# Patient Record
Sex: Female | Born: 1975 | Race: Black or African American | Hispanic: No | Marital: Single | Smoking: Never smoker
Health system: Southern US, Community
[De-identification: ages and names within clinical notes are randomized; demographics above are authoritative.]

## PROBLEM LIST (undated history)

## (undated) ENCOUNTER — Inpatient Hospital Stay (HOSPITAL_COMMUNITY): Payer: Self-pay

## (undated) DIAGNOSIS — N760 Acute vaginitis: Secondary | ICD-10-CM

## (undated) DIAGNOSIS — D649 Anemia, unspecified: Secondary | ICD-10-CM

## (undated) DIAGNOSIS — B9689 Other specified bacterial agents as the cause of diseases classified elsewhere: Secondary | ICD-10-CM

---

## 1998-02-16 ENCOUNTER — Encounter: Payer: Self-pay | Admitting: Obstetrics

## 1998-02-16 ENCOUNTER — Inpatient Hospital Stay (HOSPITAL_COMMUNITY): Admission: AD | Admit: 1998-02-16 | Discharge: 1998-02-16 | Payer: Self-pay | Admitting: Obstetrics

## 1998-02-18 ENCOUNTER — Inpatient Hospital Stay (HOSPITAL_COMMUNITY): Admission: AD | Admit: 1998-02-18 | Discharge: 1998-02-18 | Payer: Self-pay | Admitting: Obstetrics

## 1998-02-25 ENCOUNTER — Encounter: Admission: RE | Admit: 1998-02-25 | Discharge: 1998-02-25 | Payer: Self-pay | Admitting: Family Medicine

## 1998-03-09 ENCOUNTER — Encounter: Admission: RE | Admit: 1998-03-09 | Discharge: 1998-03-09 | Payer: Self-pay | Admitting: Family Medicine

## 1998-03-30 ENCOUNTER — Encounter: Admission: RE | Admit: 1998-03-30 | Discharge: 1998-03-30 | Payer: Self-pay | Admitting: Family Medicine

## 1998-05-10 ENCOUNTER — Encounter: Admission: RE | Admit: 1998-05-10 | Discharge: 1998-05-10 | Payer: Self-pay | Admitting: Family Medicine

## 1998-05-17 ENCOUNTER — Encounter: Admission: RE | Admit: 1998-05-17 | Discharge: 1998-05-17 | Payer: Self-pay | Admitting: Family Medicine

## 1998-05-24 ENCOUNTER — Encounter: Admission: RE | Admit: 1998-05-24 | Discharge: 1998-05-24 | Payer: Self-pay | Admitting: Sports Medicine

## 1998-06-09 ENCOUNTER — Inpatient Hospital Stay (HOSPITAL_COMMUNITY): Admission: AD | Admit: 1998-06-09 | Discharge: 1998-06-11 | Payer: Self-pay | Admitting: Obstetrics & Gynecology

## 1998-06-10 ENCOUNTER — Encounter: Admission: RE | Admit: 1998-06-10 | Discharge: 1998-06-10 | Payer: Self-pay | Admitting: Family Medicine

## 1998-09-20 ENCOUNTER — Encounter: Payer: Self-pay | Admitting: *Deleted

## 1998-09-20 ENCOUNTER — Emergency Department (HOSPITAL_COMMUNITY): Admission: EM | Admit: 1998-09-20 | Discharge: 1998-09-20 | Payer: Self-pay | Admitting: *Deleted

## 1998-09-30 ENCOUNTER — Encounter: Admission: RE | Admit: 1998-09-30 | Discharge: 1998-09-30 | Payer: Self-pay | Admitting: Family Medicine

## 1998-10-05 ENCOUNTER — Encounter: Admission: RE | Admit: 1998-10-05 | Discharge: 1998-10-05 | Payer: Self-pay | Admitting: Family Medicine

## 1998-10-17 ENCOUNTER — Encounter: Admission: RE | Admit: 1998-10-17 | Discharge: 1998-10-17 | Payer: Self-pay | Admitting: Sports Medicine

## 1998-10-26 ENCOUNTER — Encounter: Admission: RE | Admit: 1998-10-26 | Discharge: 1998-10-26 | Payer: Self-pay | Admitting: Family Medicine

## 1998-12-06 ENCOUNTER — Emergency Department (HOSPITAL_COMMUNITY): Admission: EM | Admit: 1998-12-06 | Discharge: 1998-12-06 | Payer: Self-pay | Admitting: Emergency Medicine

## 1998-12-07 ENCOUNTER — Encounter: Payer: Self-pay | Admitting: Emergency Medicine

## 1999-02-02 ENCOUNTER — Encounter: Admission: RE | Admit: 1999-02-02 | Discharge: 1999-02-02 | Payer: Self-pay | Admitting: Family Medicine

## 1999-07-13 ENCOUNTER — Encounter: Admission: RE | Admit: 1999-07-13 | Discharge: 1999-07-13 | Payer: Self-pay | Admitting: Family Medicine

## 1999-09-21 ENCOUNTER — Encounter: Admission: RE | Admit: 1999-09-21 | Discharge: 1999-09-21 | Payer: Self-pay | Admitting: Family Medicine

## 1999-09-21 ENCOUNTER — Other Ambulatory Visit: Admission: RE | Admit: 1999-09-21 | Discharge: 1999-09-21 | Payer: Self-pay | Admitting: Obstetrics & Gynecology

## 1999-11-06 ENCOUNTER — Emergency Department (HOSPITAL_COMMUNITY): Admission: EM | Admit: 1999-11-06 | Discharge: 1999-11-06 | Payer: Self-pay | Admitting: Emergency Medicine

## 1999-12-27 ENCOUNTER — Emergency Department (HOSPITAL_COMMUNITY): Admission: EM | Admit: 1999-12-27 | Discharge: 1999-12-28 | Payer: Self-pay | Admitting: Emergency Medicine

## 1999-12-28 ENCOUNTER — Encounter: Payer: Self-pay | Admitting: Emergency Medicine

## 2000-01-31 ENCOUNTER — Encounter: Admission: RE | Admit: 2000-01-31 | Discharge: 2000-01-31 | Payer: Self-pay | Admitting: Family Medicine

## 2000-10-08 ENCOUNTER — Encounter: Admission: RE | Admit: 2000-10-08 | Discharge: 2000-10-08 | Payer: Self-pay | Admitting: Family Medicine

## 2001-03-06 ENCOUNTER — Encounter: Admission: RE | Admit: 2001-03-06 | Discharge: 2001-03-06 | Payer: Self-pay | Admitting: Family Medicine

## 2001-11-13 ENCOUNTER — Emergency Department (HOSPITAL_COMMUNITY): Admission: EM | Admit: 2001-11-13 | Discharge: 2001-11-13 | Payer: Self-pay

## 2002-06-04 ENCOUNTER — Emergency Department (HOSPITAL_COMMUNITY): Admission: EM | Admit: 2002-06-04 | Discharge: 2002-06-04 | Payer: Self-pay | Admitting: *Deleted

## 2002-11-09 ENCOUNTER — Encounter: Payer: Self-pay | Admitting: Emergency Medicine

## 2002-11-09 ENCOUNTER — Emergency Department (HOSPITAL_COMMUNITY): Admission: EM | Admit: 2002-11-09 | Discharge: 2002-11-09 | Payer: Self-pay | Admitting: Emergency Medicine

## 2002-11-26 ENCOUNTER — Other Ambulatory Visit: Admission: RE | Admit: 2002-11-26 | Discharge: 2002-11-26 | Payer: Self-pay | Admitting: Family Medicine

## 2002-11-26 ENCOUNTER — Encounter: Admission: RE | Admit: 2002-11-26 | Discharge: 2002-11-26 | Payer: Self-pay | Admitting: Family Medicine

## 2007-03-12 ENCOUNTER — Telehealth: Payer: Self-pay | Admitting: *Deleted

## 2007-03-12 ENCOUNTER — Emergency Department (HOSPITAL_COMMUNITY): Admission: EM | Admit: 2007-03-12 | Discharge: 2007-03-12 | Payer: Self-pay | Admitting: Emergency Medicine

## 2007-03-17 ENCOUNTER — Emergency Department (HOSPITAL_COMMUNITY): Admission: EM | Admit: 2007-03-17 | Discharge: 2007-03-17 | Payer: Self-pay | Admitting: Family Medicine

## 2007-05-04 ENCOUNTER — Emergency Department (HOSPITAL_COMMUNITY): Admission: EM | Admit: 2007-05-04 | Discharge: 2007-05-04 | Payer: Self-pay | Admitting: Family Medicine

## 2010-03-30 ENCOUNTER — Emergency Department (HOSPITAL_COMMUNITY): Admission: EM | Admit: 2010-03-30 | Discharge: 2010-03-30 | Payer: Self-pay | Admitting: Family Medicine

## 2010-09-14 LAB — HEPATIC FUNCTION PANEL
ALT: 14 U/L (ref 0–35)
AST: 20 U/L (ref 0–37)
Albumin: 4.1 g/dL (ref 3.5–5.2)
Alkaline Phosphatase: 57 U/L (ref 39–117)
Indirect Bilirubin: 1 mg/dL — ABNORMAL HIGH (ref 0.3–0.9)
Total Protein: 7 g/dL (ref 6.0–8.3)

## 2010-09-14 LAB — DIFFERENTIAL
Basophils Absolute: 0 10*3/uL (ref 0.0–0.1)
Eosinophils Relative: 1 % (ref 0–5)
Lymphocytes Relative: 54 % — ABNORMAL HIGH (ref 12–46)
Lymphs Abs: 2.1 10*3/uL (ref 0.7–4.0)
Neutro Abs: 1.4 10*3/uL — ABNORMAL LOW (ref 1.7–7.7)
Neutrophils Relative %: 38 % — ABNORMAL LOW (ref 43–77)

## 2010-09-14 LAB — WET PREP, GENITAL
Clue Cells Wet Prep HPF POC: NONE SEEN
Trich, Wet Prep: NONE SEEN

## 2010-09-14 LAB — POCT URINALYSIS DIPSTICK
Glucose, UA: NEGATIVE mg/dL
Nitrite: NEGATIVE
pH: 7 (ref 5.0–8.0)

## 2010-09-14 LAB — POCT I-STAT, CHEM 8
BUN: 9 mg/dL (ref 6–23)
Calcium, Ion: 1.2 mmol/L (ref 1.12–1.32)
Chloride: 107 mEq/L (ref 96–112)
Creatinine, Ser: 0.8 mg/dL (ref 0.4–1.2)
TCO2: 25 mmol/L (ref 0–100)

## 2010-09-14 LAB — CBC
MCV: 93.2 fL (ref 78.0–100.0)
Platelets: 189 10*3/uL (ref 150–400)
RBC: 4.55 MIL/uL (ref 3.87–5.11)
RDW: 12.1 % (ref 11.5–15.5)
WBC: 3.8 10*3/uL — ABNORMAL LOW (ref 4.0–10.5)

## 2010-09-14 LAB — GC/CHLAMYDIA PROBE AMP, GENITAL: GC Probe Amp, Genital: NEGATIVE

## 2010-09-14 LAB — POCT PREGNANCY, URINE: Preg Test, Ur: NEGATIVE

## 2011-04-10 LAB — WET PREP, GENITAL: Yeast Wet Prep HPF POC: NONE SEEN

## 2011-04-10 LAB — GC/CHLAMYDIA PROBE AMP, GENITAL: Chlamydia, DNA Probe: NEGATIVE

## 2011-04-12 LAB — I-STAT 8, (EC8 V) (CONVERTED LAB)
Acid-base deficit: 1
BUN: 9
Chloride: 107
HCT: 41
Hemoglobin: 13.9
Operator id: 116391
Potassium: 3.7

## 2011-04-12 LAB — POCT URINALYSIS DIP (DEVICE)
Nitrite: POSITIVE — AB
Operator id: 116391
Protein, ur: NEGATIVE
Urobilinogen, UA: 0.2

## 2011-04-12 LAB — DIFFERENTIAL
Lymphs Abs: 1.8
Monocytes Absolute: 0.3
Monocytes Relative: 8
Neutro Abs: 2.2
Neutrophils Relative %: 51

## 2011-04-12 LAB — CBC
Hemoglobin: 10.9 — ABNORMAL LOW
MCV: 79.5
RBC: 4.26
WBC: 4.4

## 2011-04-12 LAB — POCT I-STAT CREATININE: Creatinine, Ser: 1.1

## 2011-04-12 LAB — POCT PREGNANCY, URINE: Preg Test, Ur: NEGATIVE

## 2011-04-13 LAB — CULTURE, BLOOD (ROUTINE X 2)
Culture: NO GROWTH
Culture: NO GROWTH

## 2011-04-13 LAB — URINALYSIS, ROUTINE W REFLEX MICROSCOPIC
Bilirubin Urine: NEGATIVE
Glucose, UA: NEGATIVE
Ketones, ur: 15 — AB
Specific Gravity, Urine: 1.007
pH: 8.5 — ABNORMAL HIGH

## 2011-04-13 LAB — CBC
HCT: 35.4 — ABNORMAL LOW
Hemoglobin: 11.4 — ABNORMAL LOW
MCHC: 32.2
MCV: 80.5
Platelets: 292
RBC: 4.4
RDW: 16.1 — ABNORMAL HIGH
WBC: 11.3 — ABNORMAL HIGH

## 2011-04-13 LAB — POCT I-STAT CREATININE
Creatinine, Ser: 1
Operator id: 288331

## 2011-04-13 LAB — I-STAT 8, (EC8 V) (CONVERTED LAB)
Acid-base deficit: 1
Bicarbonate: 18.1 — ABNORMAL LOW
HCT: 38
Operator id: 288331
pCO2, Ven: 18.5 — ABNORMAL LOW
pH, Ven: 7.6 — ABNORMAL HIGH

## 2011-04-13 LAB — DIFFERENTIAL
Eosinophils Absolute: 0
Lymphs Abs: 0.7
Neutro Abs: 10.3 — ABNORMAL HIGH
Neutrophils Relative %: 91 — ABNORMAL HIGH

## 2011-04-13 LAB — POCT PREGNANCY, URINE
Operator id: 28833
Preg Test, Ur: NEGATIVE

## 2011-04-13 LAB — URINE MICROSCOPIC-ADD ON

## 2011-10-10 ENCOUNTER — Ambulatory Visit (INDEPENDENT_AMBULATORY_CARE_PROVIDER_SITE_OTHER): Payer: Managed Care, Other (non HMO) | Admitting: Internal Medicine

## 2011-10-10 ENCOUNTER — Ambulatory Visit: Payer: Managed Care, Other (non HMO)

## 2011-10-10 VITALS — BP 117/75 | HR 92 | Temp 98.4°F | Resp 16 | Ht 65.75 in | Wt 113.0 lb

## 2011-10-10 DIAGNOSIS — J029 Acute pharyngitis, unspecified: Secondary | ICD-10-CM

## 2011-10-10 DIAGNOSIS — R10A0 Flank pain, unspecified side: Secondary | ICD-10-CM

## 2011-10-10 DIAGNOSIS — IMO0001 Reserved for inherently not codable concepts without codable children: Secondary | ICD-10-CM

## 2011-10-10 DIAGNOSIS — J40 Bronchitis, not specified as acute or chronic: Secondary | ICD-10-CM

## 2011-10-10 DIAGNOSIS — R109 Unspecified abdominal pain: Secondary | ICD-10-CM

## 2011-10-10 DIAGNOSIS — M7918 Myalgia, other site: Secondary | ICD-10-CM

## 2011-10-10 LAB — POCT URINALYSIS DIPSTICK
Glucose, UA: NEGATIVE
Nitrite, UA: NEGATIVE
Urobilinogen, UA: 0.2

## 2011-10-10 LAB — POCT UA - MICROSCOPIC ONLY
Casts, Ur, LPF, POC: NEGATIVE
Crystals, Ur, HPF, POC: NEGATIVE
Yeast, UA: NEGATIVE

## 2011-10-10 LAB — POCT RAPID STREP A (OFFICE): Rapid Strep A Screen: NEGATIVE

## 2011-10-10 MED ORDER — HYDROCODONE-ACETAMINOPHEN 5-325 MG PO TABS: 1.0000 | ORAL_TABLET | Freq: Four times a day (QID) | ORAL | Status: AC | PRN

## 2011-10-10 MED ORDER — AZITHROMYCIN 500 MG PO TABS: 500.0000 mg | ORAL_TABLET | Freq: Every day | ORAL | Status: AC

## 2011-10-10 NOTE — Progress Notes (Signed)
Subjective:    Patient ID: Kathy Sharp, female    DOB: 1975-12-16, 36 y.o.   MRN: [id number]  HPI Onset of illness sat with n/v/d/- resolved next day developed sorethroat and cough Cough severe keeps her up at night chills low grade temp? Pain 6/10 r flank and mid back worse with cough and motion no radiation moderate in severity No dysuria iud on menses now     Review of Systems  Constitutional: Positive for chills, appetite change and fatigue.  HENT: Negative.   Eyes: Negative.   Respiratory: Positive for cough. Negative for shortness of breath.   Cardiovascular: Negative.   Gastrointestinal: Negative.   Genitourinary: Negative.   Musculoskeletal: Positive for back pain.  Skin: Negative.   Neurological: Negative.   Hematological: Negative.   Psychiatric/Behavioral: Negative.   All other systems reviewed and are negative.       Objective:   Physical Exam  Vitals reviewed. Constitutional: She is oriented to person, place, and time. She appears well-developed and well-nourished.  HENT:  Head: Normocephalic and atraumatic.  Right Ear: External ear normal.  Left Ear: External ear normal.  Nose: Nose normal.  Mouth/Throat: Oropharyngeal exudate present.  Eyes: Conjunctivae and EOM are normal. Pupils are equal, round, and reactive to light.  Neck: Normal range of motion. Neck supple.  Cardiovascular: Normal rate, regular rhythm and normal heart sounds.   Pulmonary/Chest: No respiratory distress. She has no wheezes. She has no rales. She exhibits no tenderness.       Rhonchi decrease bs rll  Abdominal: Soft. Bowel sounds are normal.  Musculoskeletal: She exhibits tenderness.       r flank  Lymphadenopathy:    She has no cervical adenopathy.  Neurological: She is alert and oriented to person, place, and time. She has normal reflexes.  Skin: Skin is warm and dry.  Psychiatric: She has a normal mood and affect. Her behavior is normal. Judgment and thought content  normal.    Results for orders placed in visit on 10/10/11  POCT RAPID STREP A (OFFICE)      Component Value Range   Rapid Strep A Screen Negative  Negative   POCT URINALYSIS DIPSTICK      Component Value Range   Color, UA yellow     Clarity, UA clear     Glucose, UA neg     Bilirubin, UA neg     Ketones, UA neg     Spec Grav, UA 1.020     Blood, UA mod     pH, UA 7.0     Protein, UA trace     Urobilinogen, UA 0.2     Nitrite, UA neg     Leukocytes, UA Negative    POCT UA - MICROSCOPIC ONLY      Component Value Range   WBC, Ur, HPF, POC 2-4     RBC, urine, microscopic 1-3     Bacteria, U Microscopic trace     Mucus, UA 2+     Epithelial cells, urine per micros 2-9     Crystals, Ur, HPF, POC neg     Casts, Ur, LPF, POC neg     Yeast, UA neg     strep negitives urine normal [facility] reading (PRIMARY) by  Dr. Mindi Junker Chest xray no infiltrate.    Assessment & Plan:  Cough suspect bronchitus will check chest xray since pain and decrease bs in the r lower lobe. Also will check ua to evaluate for uti and will  check strep screen because of the sore throat.

## 2012-02-12 ENCOUNTER — Ambulatory Visit (INDEPENDENT_AMBULATORY_CARE_PROVIDER_SITE_OTHER): Payer: Managed Care, Other (non HMO) | Admitting: Physician Assistant

## 2012-02-12 VITALS — BP 120/60 | HR 63 | Temp 98.3°F | Resp 16 | Ht 64.75 in | Wt 114.4 lb

## 2012-02-12 DIAGNOSIS — N949 Unspecified condition associated with female genital organs and menstrual cycle: Secondary | ICD-10-CM

## 2012-02-12 DIAGNOSIS — R102 Pelvic and perineal pain: Secondary | ICD-10-CM

## 2012-02-12 DIAGNOSIS — N912 Amenorrhea, unspecified: Secondary | ICD-10-CM

## 2012-02-12 DIAGNOSIS — Z01419 Encounter for gynecological examination (general) (routine) without abnormal findings: Secondary | ICD-10-CM

## 2012-02-12 DIAGNOSIS — N938 Other specified abnormal uterine and vaginal bleeding: Secondary | ICD-10-CM

## 2012-02-12 LAB — POCT CBC
Granulocyte percent: 44.4 %G (ref 37–80)
MCV: 89.4 fL (ref 80–97)
MID (cbc): 0.5 (ref 0–0.9)
MPV: 8.6 fL (ref 0–99.8)
POC Granulocyte: 2.2 (ref 2–6.9)
POC LYMPH PERCENT: 45.4 %L (ref 10–50)
POC MID %: 10.2 %M (ref 0–12)
Platelet Count, POC: 259 10*3/uL (ref 142–424)
RBC: 4.63 M/uL (ref 4.04–5.48)
RDW, POC: 17.1 %

## 2012-02-12 LAB — POCT URINE PREGNANCY: Preg Test, Ur: NEGATIVE

## 2012-02-12 LAB — POCT WET PREP WITH KOH
KOH Prep POC: NEGATIVE
Trichomonas, UA: NEGATIVE

## 2012-02-12 MED ORDER — FLUCONAZOLE 150 MG PO TABS: 150.0000 mg | ORAL_TABLET | Freq: Once | ORAL | Status: AC

## 2012-02-12 MED ORDER — EPINEPHRINE 0.3 MG/0.3ML IJ DEVI: 0.3000 mg | Freq: Once | INTRAMUSCULAR | Status: DC

## 2012-02-12 MED ORDER — LEVONORGESTREL-ETHINYL ESTRAD 0.1-20 MG-MCG PO TABS: 1.0000 | ORAL_TABLET | Freq: Every day | ORAL | Status: DC

## 2012-02-12 MED ORDER — AZITHROMYCIN 250 MG PO TABS: ORAL_TABLET | ORAL | Status: AC

## 2012-02-12 MED ORDER — CEFIXIME 400 MG PO TABS: 400.0000 mg | ORAL_TABLET | Freq: Once | ORAL | Status: AC

## 2012-02-12 NOTE — Progress Notes (Signed)
Subjective:    Patient ID: Kathy Sharp, female    DOB: 12/07/1975, 36 y.o.   MRN: [id number]  HPI 36 yr old AAF presents with 3 week h/o pelvic pain and abnormal bleeding.  She had an IUD placed 1 year ago and has had no bleeding since until 3 weeks ago.  New partner X 6 months, before that was monogamous with previous partner for 8 years. She used a douche about 5 days ago without any help.  She has also noticed abnormal  Discharge and a foul odor started this week. She is still spotting.  She has decided she wants to have her IUD removed. She feels like the IUD itself is causing a lot of her problems.   Review of Systems  All other systems reviewed and are negative.       Objective:   Physical Exam  Nursing note and vitals reviewed. Constitutional: She is oriented to person, place, and time. She appears well-developed and well-nourished.  HENT:  Head: Normocephalic and atraumatic.  Cardiovascular: Normal rate, normal heart sounds and intact distal pulses.   Pulmonary/Chest: Effort normal and breath sounds normal.  Genitourinary: Vagina normal.       Blood and scant yellowish discharge present. Cervix appears normal and IUD in place. Ringed forceps to grasp IUD string and removed without complication.  papsmear and wet prep taken. Neg chandelier sign. Mild to mod TTP over uterus and B adnexa upon bimanual exam.  Neurological: She is alert and oriented to person, place, and time.  Skin: Skin is warm and dry.  Psychiatric: She has a normal mood and affect. Her behavior is normal. Thought content normal.     Results for orders placed in visit on 02/12/12  POCT WET PREP WITH KOH      Component Value Range   Trichomonas, UA Negative     Clue Cells Wet Prep HPF POC 1-2     Epithelial Wet Prep HPF POC 2-3     Yeast Wet Prep HPF POC neg     Bacteria Wet Prep HPF POC 1+     RBC Wet Prep HPF POC 2-4     WBC Wet Prep HPF POC 2-4     KOH Prep POC Negative    POCT URINE PREGNANCY   Component Value Range   Preg Test, Ur Negative    POCT CBC      Component Value Range   WBC 4.9  4.6 - 10.2 K/uL   Lymph, poc 2.2  0.6 - 3.4   POC LYMPH PERCENT 45.4  10 - 50 %L   MID (cbc) 0.5  0 - 0.9   POC MID % 10.2  0 - 12 %M   POC Granulocyte 2.2  2 - 6.9   Granulocyte percent 44.4  37 - 80 %G   RBC 4.63  4.04 - 5.48 M/uL   Hemoglobin 12.4  12.2 - 16.2 g/dL   HCT, POC 78.2  95.6 - 47.9 %   MCV 89.4  80 - 97 fL   MCH, POC 26.8 (*) 27 - 31.2 pg   MCHC 30.0 (*) 31.8 - 35.4 g/dL   RDW, POC 21.3     Platelet Count, POC 259  142 - 424 K/uL   MPV 8.6  0 - 99.8 fL         Assessment & Plan:  Pelvic pain and dysfunctional uterine bleeding-cover for GC/chlamydia bc high risk for disseminated disease if either are positive due to IUD  and removal. Start Alesse. Safe sex practices.  See sent labs. IUD removal successful.

## 2012-02-18 LAB — PAP IG, CT-NG NAA, HPV HIGH-RISK: HPV DNA High Risk: NOT DETECTED

## 2012-04-22 ENCOUNTER — Ambulatory Visit: Payer: Managed Care, Other (non HMO)

## 2012-04-22 ENCOUNTER — Ambulatory Visit (INDEPENDENT_AMBULATORY_CARE_PROVIDER_SITE_OTHER): Payer: Managed Care, Other (non HMO) | Admitting: Family Medicine

## 2012-04-22 VITALS — BP 127/84 | HR 73 | Temp 98.2°F | Resp 17 | Ht 65.5 in | Wt 118.0 lb

## 2012-04-22 DIAGNOSIS — M79641 Pain in right hand: Secondary | ICD-10-CM

## 2012-04-22 DIAGNOSIS — M109 Gout, unspecified: Secondary | ICD-10-CM

## 2012-04-22 DIAGNOSIS — M25549 Pain in joints of unspecified hand: Secondary | ICD-10-CM

## 2012-04-22 LAB — POCT CBC
HCT, POC: 40.3 % (ref 37.7–47.9)
Hemoglobin: 12 g/dL — AB (ref 12.2–16.2)
Lymph, poc: 2.3 (ref 0.6–3.4)
MCH, POC: 27.3 pg (ref 27–31.2)
MCHC: 29.8 g/dL — AB (ref 31.8–35.4)
MPV: 8 fL (ref 0–99.8)
POC MID %: 8.8 %M (ref 0–12)
RBC: 4.4 M/uL (ref 4.04–5.48)
WBC: 5 10*3/uL (ref 4.6–10.2)

## 2012-04-22 MED ORDER — KETOROLAC TROMETHAMINE 60 MG/2ML IM SOLN
60.0000 mg | Freq: Once | INTRAMUSCULAR | Status: AC
  Administered 2012-04-22: 60 mg via INTRAMUSCULAR

## 2012-04-22 MED ORDER — INDOMETHACIN 50 MG PO CAPS: 50.0000 mg | ORAL_CAPSULE | Freq: Two times a day (BID) | ORAL | Status: DC

## 2012-04-22 NOTE — Patient Instructions (Addendum)
I think you have gout.    Take the Indomethacin 3 times a day until the pain becomes better, then decrease to twice or once a day after that.    Do not take this for more than 5 days.   Come back and see [place] in 2 days so we can make sure you're getting better.    Gout Gout is an inflammatory condition (arthritis) caused by a buildup of uric acid crystals in the joints. Uric acid is a chemical that is normally present in the blood. Under some circumstances, uric acid can form into crystals in your joints. This causes joint redness, soreness, and swelling (inflammation). Repeat attacks are common. Over time, uric acid crystals can form into masses (tophi) near a joint, causing disfigurement. Gout is treatable and often preventable. CAUSES  The disease begins with elevated levels of uric acid in the blood. Uric acid is produced by your body when it breaks down a naturally found substance called purines. This also happens when you eat certain foods such as meats and fish. Causes of an elevated uric acid level include:  Being passed down from parent to child (heredity).  Diseases that cause increased uric acid production (obesity, psoriasis, some cancers).  Excessive alcohol use.  Diet, especially diets rich in meat and seafood.  Medicines, including certain cancer-fighting drugs (chemotherapy), diuretics, and aspirin.  Chronic kidney disease. The kidneys are no longer able to remove uric acid well.  Problems with metabolism. Conditions strongly associated with gout include:  Obesity.  High blood pressure.  High cholesterol.  Diabetes. Not everyone with elevated uric acid levels gets gout. It is not understood why some people get gout and others do not. Surgery, joint injury, and eating too much of certain foods are some of the factors that can lead to gout. SYMPTOMS   An attack of gout comes on quickly. It causes intense pain with redness, swelling, and warmth in a joint.  Fever  can occur.  Often, only one joint is involved. Certain joints are more commonly involved:  Base of the big toe.  Knee.  Ankle.  Wrist.  Finger. Without treatment, an attack usually goes away in a few days to weeks. Between attacks, you usually will not have symptoms, which is different from many other forms of arthritis. DIAGNOSIS  Your caregiver will suspect gout based on your symptoms and exam. Removal of fluid from the joint (arthrocentesis) is done to check for uric acid crystals. Your caregiver will give you a medicine that numbs the area (local anesthetic) and use a needle to remove joint fluid for exam. Gout is confirmed when uric acid crystals are seen in joint fluid, using a special microscope. Sometimes, blood, urine, and X-ray tests are also used. TREATMENT  There are 2 phases to gout treatment: treating the sudden onset (acute) attack and preventing attacks (prophylaxis). Treatment of an Acute Attack  Medicines are used. These include anti-inflammatory medicines or steroid medicines.  An injection of steroid medicine into the affected joint is sometimes necessary.  The painful joint is rested. Movement can worsen the arthritis.  You may use warm or cold treatments on painful joints, depending which works best for you.  Discuss the use of coffee, vitamin C, or cherries with your caregiver. These may be helpful treatment options. Treatment to Prevent Attacks After the acute attack subsides, your caregiver may advise prophylactic medicine. These medicines either help your kidneys eliminate uric acid from your body or decrease your uric acid production.  You may need to stay on these medicines for a very long time. The early phase of treatment with prophylactic medicine can be associated with an increase in acute gout attacks. For this reason, during the first few months of treatment, your caregiver may also advise you to take medicines usually used for acute gout treatment. Be  sure you understand your caregiver's directions. You should also discuss dietary treatment with your caregiver. Certain foods such as meats and fish can increase uric acid levels. Other foods such as dairy can decrease levels. Your caregiver can give you a list of foods to avoid. HOME CARE INSTRUCTIONS   Do not take aspirin to relieve pain. This raises uric acid levels.  Only take over-the-counter or prescription medicines for pain, discomfort, or fever as directed by your caregiver.  Rest the joint as much as possible. When in bed, keep sheets and blankets off painful areas.  Keep the affected joint raised (elevated).  Use crutches if the painful joint is in your leg.  Drink enough water and fluids to keep your urine clear or pale yellow. This helps your body get rid of uric acid. Do not drink alcoholic beverages. They slow the passage of uric acid.  Follow your caregiver's dietary instructions. Pay careful attention to the amount of protein you eat. Your daily diet should emphasize fruits, vegetables, whole grains, and fat-free or low-fat milk products.  Maintain a healthy body weight. SEEK MEDICAL CARE IF:   You have an oral temperature above 102 F (38.9 C).  You develop diarrhea, vomiting, or any side effects from medicines.  You do not feel better in 24 hours, or you are getting worse. SEEK IMMEDIATE MEDICAL CARE IF:   Your joint becomes suddenly more tender and you have:  Chills.  An oral temperature above 102 F (38.9 C), not controlled by medicine. MAKE SURE YOU:   Understand these instructions.  Will watch your condition.  Will get help right away if you are not doing well or get worse. Document Released: 06/15/2000 Document Revised: 09/10/2011 Document Reviewed: 09/26/2009 [facility] Patient Information 2013 [place], [place].

## 2012-04-22 NOTE — Progress Notes (Signed)
Patient ID: Kathy Sharp, female   DOB: 06-05-1976, 36 y.o.   MRN: [id number] Kathy Sharp is a 36 y.o. female who presents to Urgent Care today for Right hand pain:  1.  Right hand pain:  Patient playing with her dog and child 2 days ago, no injury that she knew of occurred at that time.  Noticed some soreness in Right hand later that evening.  Pain much worse next day, which was yesterday.  Noted "knot" on back of her hand.  This AM she awoke with very severe pain dorsal aspect of Right hand.  Redness persists.  Painful when clothing or bedsheet rubbed across her wrist.  Pain also with supination/pronation of hand.  No fevers or chills.  No red streaks up arm.    PMH reviewed.  ROS as above otherwise neg.  No chest pain, palpitations, SOB, Abd pain, N/V/D.  Medications reviewed. Current Outpatient Prescriptions  Medication Sig Dispense Refill  . EPINEPHrine (EPI-PEN) 0.3 mg/0.3 mL DEVI Inject 0.3 mLs (0.3 mg total) into the muscle once. In event of severe allergic reaction.  1 Device  0  . levonorgestrel-ethinyl estradiol (AVIANE,ALESSE,LESSINA) 0.1-20 MG-MCG tablet Take 1 tablet by mouth daily.  1 Package  11    Exam:  BP 127/84  Pulse 73  Temp 98.2 F (36.8 C) (Oral)  Resp 17  Ht 5' 5.5" (1.664 m)  Wt 118 lb (53.524 kg)  BMI 19.34 kg/m2  SpO2 100% Gen: Well NAD HEENT:  [place]/AT, MMM Cardiac:  Regular rate and rhythm without murmur auscultated.  Good S1/S2. Pulm:  Clear to auscultation bilaterally with good air movement.  No wheezes or rales noted.   MSK:  Right hand with diffuse soft tissue swelling located over base of 1st metacarpal.  Erythema here as well.  Very tender to palpation, even light touch.  No palpable step-off noted.  Pain with supination and pronation of hand.   SKin:  Erythema as above.  No red streaking up arm.  Rubor noted as well Lymph:  No olecranon fossa or axillary lymphadenopathy noted  [facility] reading (PRIMARY) by  Dr. Gwendolyn Grant:  Diffuse soft tissue swelling  noted over 1st metacarpal.  Old ulnar styloid fracture versus malunion noted as well.  Results for orders placed in visit on 04/22/12  POCT CBC      Component Value Range   WBC 5.0  4.6 - 10.2 K/uL   Lymph, poc 2.3  0.6 - 3.4   POC LYMPH PERCENT 46.0  10 - 50 %L   MID (cbc) 0.4  0 - 0.9   POC MID % 8.8  0 - 12 %M   POC Granulocyte 2.3  2 - 6.9   Granulocyte percent 45.2  37 - 80 %G   RBC 4.40  4.04 - 5.48 M/uL   Hemoglobin 12.0 (*) 12.2 - 16.2 g/dL   HCT, POC 66.4  40.3 - 47.9 %   MCV 91.5  80 - 97 fL   MCH, POC 27.3  27 - 31.2 pg   MCHC 29.8 (*) 31.8 - 35.4 g/dL   RDW, POC 47.4     Platelet Count, POC 287  142 - 424 K/uL   MPV 8.0  0 - 99.8 fL    Assessment and Plan:  1.  Gout:  Nontraumatic pain and swelling at base of 1st metacarpal with extensive heat.  No signs of lymphadenopathy.  Xray negative for any acute fracture.  Differential includes gout versus cellulitis versus occult fracture.  Checking CBC and uric acid today, WBC within normal limits.  FU in 48 hours for recheck and to assess for improvement.  Toradol here, Indomethacin for relief at home.  Sling for relief since pain is worsened by pronation and supination.

## 2012-04-23 NOTE — Progress Notes (Signed)
I have read, reviewed, and agree with plan of care by Dr. Walden  

## 2012-12-17 ENCOUNTER — Ambulatory Visit (INDEPENDENT_AMBULATORY_CARE_PROVIDER_SITE_OTHER): Payer: BC Managed Care – PPO | Admitting: Family Medicine

## 2012-12-17 VITALS — BP 110/82 | HR 70 | Temp 98.3°F | Resp 18 | Wt 112.0 lb

## 2012-12-17 DIAGNOSIS — R112 Nausea with vomiting, unspecified: Secondary | ICD-10-CM

## 2012-12-17 DIAGNOSIS — R1013 Epigastric pain: Secondary | ICD-10-CM

## 2012-12-17 LAB — COMPREHENSIVE METABOLIC PANEL
ALT: 15 U/L (ref 0–35)
AST: 20 U/L (ref 0–37)
Alkaline Phosphatase: 55 U/L (ref 39–117)
CO2: 26 mEq/L (ref 19–32)
Creat: 0.83 mg/dL (ref 0.50–1.10)
Total Bilirubin: 1.2 mg/dL (ref 0.3–1.2)

## 2012-12-17 LAB — POCT UA - MICROSCOPIC ONLY
Casts, Ur, LPF, POC: NEGATIVE
Crystals, Ur, HPF, POC: NEGATIVE
Mucus, UA: POSITIVE
Yeast, UA: NEGATIVE

## 2012-12-17 LAB — POCT URINALYSIS DIPSTICK
Blood, UA: NEGATIVE
Nitrite, UA: NEGATIVE
Protein, UA: 30
Spec Grav, UA: >=1.03
Urobilinogen, UA: 0.2
pH, UA: 6

## 2012-12-17 LAB — POCT CBC
MCH, POC: 26.9 pg — AB (ref 27–31.2)
MCV: 89.8 fL (ref 80–97)
MID (cbc): 0.3 (ref 0–0.9)
MPV: 8.5 fL (ref 0–99.8)
POC LYMPH PERCENT: 36.8 %L (ref 10–50)
POC MID %: 7.4 %M (ref 0–12)
Platelet Count, POC: 251 10*3/uL (ref 142–424)
RDW, POC: 15.1 %
WBC: 4.3 10*3/uL — AB (ref 4.6–10.2)

## 2012-12-17 MED ORDER — ONDANSETRON 8 MG PO TBDP: 8.0000 mg | ORAL_TABLET | Freq: Three times a day (TID) | ORAL | Status: DC | PRN

## 2012-12-17 NOTE — Progress Notes (Signed)
[address]   [place], [place]  [address]   [phone]  Subjective:    Patient ID: Kathy Sharp, female    DOB: 17-Mar-1976, 37 y.o.   MRN: [id number]  HPI This 37 y.o. female presents for evaluation of decreased appetite, nausea, stomach griping.  Onset five days ago.  No fever/chills/sweats.  Did suffer with loose bowels on day of onset; had four stools on day of onset; baseline once every three days.  No black or bloody stools.  No bowel movement since onset.  Drinking a lot of water but got dizzy yesterday; has not been eating much.  Nausea constant but can be intermittent.  Nausea was better yesterday; awoke in middle of night with acute onset of nausea.  Vomiting x 4 times since onset; food contents.  Non-bloody emesis.  Abdominal pain upper stomach and radiates down a little; feels like a cramp and punch; intermittent pain during the day; eating makes worse.  No dysuria, frequency, hematuria.  No vaginal discharge; no vaginal itching or irritation.  LMP 11-30-2012; menses have not been normal; bleeding was darker and discharge for two days and then stopped and then had heavy menses for two days.  IUD removed in past six months.No recent antibiotics; no recent travel; no recent camping.   Works at [employer]; Midwife.  Admits to stress; mother with cancer liver with Hepatitis C; was with [facility] but now in nursing home.  Feels badly; +malaise and fatigue thus does not feel due to stress.  Sexually active; new partner for past four months.  PCP:  [facility]   Review of Systems  Constitutional: Positive for appetite change and fatigue. Negative for fever, chills and diaphoresis.  HENT: Negative for sore throat.   Gastrointestinal: Positive for nausea, vomiting, abdominal pain and diarrhea. Negative for constipation, blood in stool, anal bleeding and rectal pain.  Genitourinary: Negative for dysuria, urgency, frequency, hematuria, flank pain and vaginal discharge.  Skin: Negative  for rash.  Neurological: Positive for dizziness and light-headedness. Negative for headaches.    History reviewed. No pertinent past medical history.  History reviewed. No pertinent past surgical history.  Prior to Admission medications   Medication Sig Start Date End Date Taking? Authorizing Provider  EPINEPHrine (EPI-PEN) 0.3 mg/0.3 mL DEVI Inject 0.3 mLs (0.3 mg total) into the muscle once. In event of severe allergic reaction. 02/12/12  Yes Anders Simmonds, PA-C  indomethacin (INDOCIN) 50 MG capsule Take 1 capsule (50 mg total) by mouth 2 (two) times daily with a meal. 04/22/12   Tobey Grim, MD  levonorgestrel-ethinyl estradiol (AVIANE,ALESSE,LESSINA) 0.1-20 MG-MCG tablet Take 1 tablet by mouth daily. 02/12/12 02/11/13  Anders Simmonds, PA-C    Allergies  Allergen Reactions  . Penicillins Rash    Reaction as a baby-hives, but she has taken cephalosporins without any problem.    History   Social History  . Marital Status: Single    Spouse Name: N/A    Number of Children: N/A  . Years of Education: N/A   Occupational History  . Not on file.   Social History Main Topics  . Smoking status: Never Smoker   . Smokeless tobacco: Not on file  . Alcohol Use: No  . Drug Use: No  . Sexually Active: Yes    Birth Control/ Protection: None   Other Topics Concern  . Not on file   Social History Narrative  . No narrative on file    Family History  Problem Relation Age of Onset  . Cancer Mother        Objective:   Physical Exam  Nursing note and vitals reviewed. Constitutional: She is oriented to person, place, and time. She appears well-developed and well-nourished. No distress.  HENT:  Head: Normocephalic and atraumatic.  Mouth/Throat: Oropharynx is clear and moist.  Eyes: Conjunctivae are normal. Pupils are equal, round, and reactive to light.  Neck: Normal range of motion. Neck supple. No thyromegaly present.  Cardiovascular: Normal rate, regular rhythm and  normal heart sounds.   No murmur heard. Pulmonary/Chest: Effort normal and breath sounds normal.  Abdominal: Soft. Bowel sounds are normal. She exhibits no distension. There is no tenderness. There is no rebound and no guarding.  Lymphadenopathy:    She has no cervical adenopathy.  Neurological: She is alert and oriented to person, place, and time.  Skin: Skin is warm and dry. No rash noted. She is not diaphoretic.  Psychiatric: She has a normal mood and affect. Her behavior is normal.   Results for orders placed in visit on 12/17/12  POCT CBC      Result Value Range   WBC 4.3 (*) 4.6 - 10.2 K/uL   Lymph, poc 1.6  0.6 - 3.4   POC LYMPH PERCENT 36.8  10 - 50 %L   MID (cbc) 0.3  0 - 0.9   POC MID % 7.4  0 - 12 %M   POC Granulocyte 2.4  2 - 6.9   Granulocyte percent 55.8  37 - 80 %G   RBC 4.61  4.04 - 5.48 M/uL   Hemoglobin 12.4  12.2 - 16.2 g/dL   HCT, POC 16.1  09.6 - 47.9 %   MCV 89.8  80 - 97 fL   MCH, POC 26.9 (*) 27 - 31.2 pg   MCHC 30.0 (*) 31.8 - 35.4 g/dL   RDW, POC 04.5     Platelet Count, POC 251  142 - 424 K/uL   MPV 8.5  0 - 99.8 fL  POCT URINALYSIS DIPSTICK      Result Value Range   Color, UA amber     Clarity, UA clear     Glucose, UA neg     Bilirubin, UA small     Ketones, UA 40     Spec Grav, UA >=1.030     Blood, UA neg     pH, UA 6.0     Protein, UA 30     Urobilinogen, UA 0.2     Nitrite, UA neg     Leukocytes, UA Negative    POCT URINE PREGNANCY      Result Value Range   Preg Test, Ur Negative    POCT UA - MICROSCOPIC ONLY      Result Value Range   WBC, Ur, HPF, POC 1-3     RBC, urine, microscopic neg     Bacteria, U Microscopic trace     Mucus, UA positive     Epithelial cells, urine per micros 1-5     Crystals, Ur, HPF, POC neg     Casts, Ur, LPF, POC neg     Yeast, UA neg         Assessment & Plan:  Nausea with vomiting - Plan: POCT CBC, POCT urinalysis dipstick, POCT urine pregnancy, POCT UA - Microscopic Only, Comprehensive metabolic  panel  Abdominal pain, epigastric - Plan: POCT CBC  1.  Nausea with vomiting:  New.  Onset five days ago.  Normal/benign  abdominal exam.  Obtain labs.  Rx for Zofran provided; BRAT diet; RTC if no improvement in 3-5 days; RTC sooner for acute worsening. 2.  Abdominal pain epigastric:  New.  Associated with vomiting, diarrhea. Benign abdominal exam.   Meds ordered this encounter  Medications  . ondansetron (ZOFRAN-ODT) 8 MG disintegrating tablet    Sig: Take 1 tablet (8 mg total) by mouth every 8 (eight) hours as needed for nausea.    Dispense:  30 tablet    Refill:  0

## 2012-12-17 NOTE — Patient Instructions (Addendum)
Viral Gastroenteritis Viral gastroenteritis is also known as stomach flu. This condition affects the stomach and intestinal tract. It can cause sudden diarrhea and vomiting. The illness typically lasts 3 to 8 days. Most people develop an immune response that eventually gets rid of the virus. While this natural response develops, the virus can make you quite ill. CAUSES  Many different viruses can cause gastroenteritis, such as rotavirus or noroviruses. You can catch one of these viruses by consuming contaminated food or water. You may also catch a virus by sharing utensils or other personal items with an infected person or by touching a contaminated surface. SYMPTOMS  The most common symptoms are diarrhea and vomiting. These problems can cause a severe loss of body fluids (dehydration) and a body salt (electrolyte) imbalance. Other symptoms may include:  Fever.  Headache.  Fatigue.  Abdominal pain. DIAGNOSIS  Your caregiver can usually diagnose viral gastroenteritis based on your symptoms and a physical exam. A stool sample may also be taken to test for the presence of viruses or other infections. TREATMENT  This illness typically goes away on its own. Treatments are aimed at rehydration. The most serious cases of viral gastroenteritis involve vomiting so severely that you are not able to keep fluids down. In these cases, fluids must be given through an intravenous line (IV). HOME CARE INSTRUCTIONS   Drink enough fluids to keep your urine clear or pale yellow. Drink small amounts of fluids frequently and increase the amounts as tolerated.  Ask your caregiver for specific rehydration instructions.  Avoid:  Foods high in sugar.  Alcohol.  Carbonated drinks.  Tobacco.  Juice.  Caffeine drinks.  Extremely hot or cold fluids.  Fatty, greasy foods.  Too much intake of anything at one time.  Dairy products until 24 to 48 hours after diarrhea stops.  You may consume probiotics.  Probiotics are active cultures of beneficial bacteria. They may lessen the amount and number of diarrheal stools in adults. Probiotics can be found in yogurt with active cultures and in supplements.  Wash your hands well to avoid spreading the virus.  Only take over-the-counter or prescription medicines for pain, discomfort, or fever as directed by your caregiver. Do not give aspirin to children. Antidiarrheal medicines are not recommended.  Ask your caregiver if you should continue to take your regular prescribed and over-the-counter medicines.  Keep all follow-up appointments as directed by your caregiver. SEEK IMMEDIATE MEDICAL CARE IF:   You are unable to keep fluids down.  You do not urinate at least once every 6 to 8 hours.  You develop shortness of breath.  You notice blood in your stool or vomit. This may look like coffee grounds.  You have abdominal pain that increases or is concentrated in one small area (localized).  You have persistent vomiting or diarrhea.  You have a fever.  The patient is a child younger than 3 months, and he or she has a fever.  The patient is a child older than 3 months, and he or she has a fever and persistent symptoms.  The patient is a child older than 3 months, and he or she has a fever and symptoms suddenly get worse.  The patient is a baby, and he or she has no tears when crying. MAKE SURE YOU:   Understand these instructions.  Will watch your condition.  Will get help right away if you are not doing well or get worse. Document Released: 06/18/2005 Document Revised: 09/10/2011 Document Reviewed: 04/04/2011   ExitCare Patient Information 2014 [employer].  

## 2013-01-11 ENCOUNTER — Ambulatory Visit (INDEPENDENT_AMBULATORY_CARE_PROVIDER_SITE_OTHER): Payer: BC Managed Care – PPO | Admitting: Emergency Medicine

## 2013-01-11 VITALS — BP 98/64 | HR 94 | Temp 98.1°F | Resp 16 | Ht 65.5 in | Wt 109.8 lb

## 2013-01-11 DIAGNOSIS — A499 Bacterial infection, unspecified: Secondary | ICD-10-CM

## 2013-01-11 DIAGNOSIS — N760 Acute vaginitis: Secondary | ICD-10-CM

## 2013-01-11 DIAGNOSIS — N898 Other specified noninflammatory disorders of vagina: Secondary | ICD-10-CM

## 2013-01-11 DIAGNOSIS — B9689 Other specified bacterial agents as the cause of diseases classified elsewhere: Secondary | ICD-10-CM

## 2013-01-11 LAB — POCT WET PREP WITH KOH
KOH Prep POC: NEGATIVE
RBC Wet Prep HPF POC: NEGATIVE
Trichomonas, UA: NEGATIVE
Yeast Wet Prep HPF POC: NEGATIVE

## 2013-01-11 MED ORDER — METRONIDAZOLE 500 MG PO TABS: 500.0000 mg | ORAL_TABLET | Freq: Two times a day (BID) | ORAL | Status: DC

## 2013-01-11 MED ORDER — LEVONORGESTREL-ETHINYL ESTRAD 0.1-20 MG-MCG PO TABS: 1.0000 | ORAL_TABLET | Freq: Every day | ORAL | Status: DC

## 2013-01-11 NOTE — Progress Notes (Signed)
[facility] [address], [place] [address] [phone]  Date:  01/11/2013   Name:  Kathy Sharp   DOB:  1975-10-03   MRN:  [id number]  PCP:  No PCP Per Patient    Chief Complaint: Vaginitis   History of Present Illness:  Kathy Sharp is a 37 y.o. very pleasant female patient who presents with the following:  Has vaginal discharge after using a OTC vaginal pH balancing gel.  Now her vaginal discharge is worse.  Has no dysuria, urgency or frequency.  Some discomfort with intercourse but denies pain.  On pill for last year after IUD removal.  No improvement with over the counter medications or other home remedies. Denies other complaint or health concern today.   There are no active problems to display for this patient.   History reviewed. No pertinent past medical history.  History reviewed. No pertinent past surgical history.  History  Substance Use Topics  . Smoking status: Never Smoker   . Smokeless tobacco: Never Used  . Alcohol Use: No    Family History  Problem Relation Age of Onset  . Cancer Mother     Allergies  Allergen Reactions  . Penicillins Rash    Reaction as a baby-hives, but she has taken cephalosporins without any problem.    Medication list has been reviewed and updated.  Current Outpatient Prescriptions on File Prior to Visit  Medication Sig Dispense Refill  . EPINEPHrine (EPI-PEN) 0.3 mg/0.3 mL DEVI Inject 0.3 mLs (0.3 mg total) into the muscle once. In event of severe allergic reaction.  1 Device  0  . indomethacin (INDOCIN) 50 MG capsule Take 1 capsule (50 mg total) by mouth 2 (two) times daily with a meal.  15 capsule  0  . levonorgestrel-ethinyl estradiol (AVIANE,ALESSE,LESSINA) 0.1-20 MG-MCG tablet Take 1 tablet by mouth daily.  1 Package  11  . ondansetron (ZOFRAN-ODT) 8 MG disintegrating tablet Take 1 tablet (8 mg total) by mouth every 8 (eight) hours as needed for nausea.  30 tablet  0   No current  facility-administered medications on file prior to visit.    Review of Systems:  As per HPI, otherwise negative.    Physical Examination: Filed Vitals:   01/11/13 1208  BP: 98/64  Pulse: 94  Temp: 98.1 F (36.7 C)  Resp: 16   Filed Vitals:   01/11/13 1208  Height: 5' 5.5" (1.664 m)  Weight: 109 lb 12.8 oz (49.805 kg)   Body mass index is 17.99 kg/(m^2). Ideal Body Weight: Weight in (lb) to have BMI = 25: 152.2   GEN: WDWN, NAD, Non-toxic, Alert & Oriented x 3 HEENT: Atraumatic, Normocephalic.  Ears and Nose: No external deformity. EXTR: No clubbing/cyanosis/edema NEURO: Normal gait.  PSYCH: Normally interactive. Conversant. Not depressed or anxious appearing.  Calm demeanor.  Pelvic - normal external genitalia, vulva, vagina, cervix, uterus and adnexa   Assessment and Plan: BV Flagyl   Signed,  Phillips Odor, MD   Results for orders placed in visit on 01/11/13  POCT WET PREP WITH KOH      Result Value Range   Trichomonas, UA Negative     Clue Cells Wet Prep HPF POC tntc     Epithelial Wet Prep HPF POC 3-5     Yeast Wet Prep HPF POC neg     Bacteria Wet Prep HPF POC 2+     RBC Wet Prep HPF POC neg     WBC Wet  Prep HPF POC neg     KOH Prep POC Negative

## 2013-01-11 NOTE — Patient Instructions (Addendum)
Bacterial Vaginosis Bacterial vaginosis (BV) is a vaginal infection where the normal balance of bacteria in the vagina is disrupted. The normal balance is then replaced by an overgrowth of certain bacteria. There are several different kinds of bacteria that can cause BV. BV is the most common vaginal infection in women of childbearing age. CAUSES   The cause of BV is not fully understood. BV develops when there is an increase or imbalance of harmful bacteria.  Some activities or behaviors can upset the normal balance of bacteria in the vagina and put women at increased risk including:  Having a new sex partner or multiple sex partners.  Douching.  Using an intrauterine device (IUD) for contraception.  It is not clear what role sexual activity plays in the development of BV. However, women that have never had sexual intercourse are rarely infected with BV. Women do not get BV from toilet seats, bedding, swimming pools or from touching objects around them.  SYMPTOMS   Grey vaginal discharge.  A fish-like odor with discharge, especially after sexual intercourse.  Itching or burning of the vagina and vulva.  Burning or pain with urination.  Some women have no signs or symptoms at all. DIAGNOSIS  Your caregiver must examine the vagina for signs of BV. Your caregiver will perform lab tests and look at the sample of vaginal fluid through a microscope. They will look for bacteria and abnormal cells (clue cells), a pH test higher than 4.5, and a positive amine test all associated with BV.  RISKS AND COMPLICATIONS   Pelvic inflammatory disease (PID).  Infections following gynecology surgery.  Developing HIV.  Developing herpes virus. TREATMENT  Sometimes BV will clear up without treatment. However, all women with symptoms of BV should be treated to avoid complications, especially if gynecology surgery is planned. Female partners generally do not need to be treated. However, BV may spread  between female sex partners so treatment is helpful in preventing a recurrence of BV.   BV may be treated with antibiotics. The antibiotics come in either pill or vaginal cream forms. Either can be used with nonpregnant or pregnant women, but the recommended dosages differ. These antibiotics are not harmful to the baby.  BV can recur after treatment. If this happens, a second round of antibiotics will often be prescribed.  Treatment is important for pregnant women. If not treated, BV can cause a premature delivery, especially for a pregnant woman who had a premature birth in the past. All pregnant women who have symptoms of BV should be checked and treated.  For chronic reoccurrence of BV, treatment with a type of prescribed gel vaginally twice a week is helpful. HOME CARE INSTRUCTIONS   Finish all medication as directed by your caregiver.  Do not have sex until treatment is completed.  Tell your sexual partner that you have a vaginal infection. They should see their caregiver and be treated if they have problems, such as a mild rash or itching.  Practice safe sex. Use condoms. Only have 1 sex partner. PREVENTION  Basic prevention steps can help reduce the risk of upsetting the natural balance of bacteria in the vagina and developing BV:  Do not have sexual intercourse (be abstinent).  Do not douche.  Use all of the medicine prescribed for treatment of BV, even if the signs and symptoms go away.  Tell your sex partner if you have BV. That way, they can be treated, if needed, to prevent reoccurrence. SEEK MEDICAL CARE IF:     Your symptoms are not improving after 3 days of treatment.  You have increased discharge, pain, or fever. MAKE SURE YOU:   Understand these instructions.  Will watch your condition.  Will get help right away if you are not doing well or get worse. FOR MORE INFORMATION  Division of STD Prevention (DSTDP), Centers for Disease Control and Prevention:  www.cdc.gov/std American Social Health Association (ASHA): www.ashastd.org  Document Released: 06/18/2005 Document Revised: 09/10/2011 Document Reviewed: 12/09/2008 ExitCare Patient Information 2014 [employer].  

## 2013-02-26 ENCOUNTER — Ambulatory Visit (INDEPENDENT_AMBULATORY_CARE_PROVIDER_SITE_OTHER): Payer: BC Managed Care – PPO | Admitting: Emergency Medicine

## 2013-02-26 VITALS — BP 112/72 | HR 66 | Temp 98.8°F | Resp 18 | Ht 64.5 in | Wt 110.4 lb

## 2013-02-26 DIAGNOSIS — A088 Other specified intestinal infections: Secondary | ICD-10-CM

## 2013-02-26 DIAGNOSIS — R112 Nausea with vomiting, unspecified: Secondary | ICD-10-CM

## 2013-02-26 LAB — POCT CBC
Hemoglobin: 12.3 g/dL (ref 12.2–16.2)
Lymph, poc: 2.2 (ref 0.6–3.4)
MCHC: 30.7 g/dL — AB (ref 31.8–35.4)
MPV: 8.3 fL (ref 0–99.8)
POC Granulocyte: 3 (ref 2–6.9)
POC MID %: 6.7 %M (ref 0–12)
RDW, POC: 15.5 %

## 2013-02-26 MED ORDER — ONDANSETRON 8 MG PO TBDP: 8.0000 mg | ORAL_TABLET | Freq: Three times a day (TID) | ORAL | Status: DC | PRN

## 2013-02-26 MED ORDER — LOPERAMIDE HCL 2 MG PO TABS: ORAL_TABLET | ORAL | Status: DC

## 2013-02-26 NOTE — Progress Notes (Signed)
[facility] [address] [place] [address] [phone]  Date:  02/26/2013   Name:  Kathy Sharp   DOB:  1976-03-28   MRN:  [id number]  PCP:  No PCP Per Patient    Chief Complaint: Abdominal Pain   History of Present Illness:  Kathy Sharp is a 37 y.o. very pleasant female patient who presents with the following:  Ill since Tuesday with nausea and no actual vomiting.  Has crampy lower abdominal pain that comes in waves associated with loose stools. The patient has no complaint of blood, mucous, or pus in her stools.  No fever or chills. No appetite.  No sick contacts.  No GU or GYN symptoms.  No improvement with over the counter medications or other home remedies. Denies other complaint or health concern today.   There are no active problems to display for this patient.   History reviewed. No pertinent past medical history.  History reviewed. No pertinent past surgical history.  History  Substance Use Topics  . Smoking status: Never Smoker   . Smokeless tobacco: Never Used  . Alcohol Use: No    Family History  Problem Relation Age of Onset  . Cancer Mother     Allergies  Allergen Reactions  . Penicillins Rash    Reaction as a baby-hives, but she has taken cephalosporins without any problem.    Medication list has been reviewed and updated.  Current Outpatient Prescriptions on File Prior to Visit  Medication Sig Dispense Refill  . EPINEPHrine (EPI-PEN) 0.3 mg/0.3 mL DEVI Inject 0.3 mLs (0.3 mg total) into the muscle once. In event of severe allergic reaction.  1 Device  0  . levonorgestrel-ethinyl estradiol (AVIANE,ALESSE,LESSINA) 0.1-20 MG-MCG tablet Take 1 tablet by mouth daily.  3 Package  3  . indomethacin (INDOCIN) 50 MG capsule Take 1 capsule (50 mg total) by mouth 2 (two) times daily with a meal.  15 capsule  0  . metroNIDAZOLE (FLAGYL) 500 MG tablet Take 1 tablet (500 mg total) by mouth 2 (two) times daily with a meal. DO NOT  CONSUME ALCOHOL WHILE TAKING THIS MEDICATION.  14 tablet  0  . ondansetron (ZOFRAN-ODT) 8 MG disintegrating tablet Take 1 tablet (8 mg total) by mouth every 8 (eight) hours as needed for nausea.  30 tablet  0   No current facility-administered medications on file prior to visit.    Review of Systems:  As per HPI, otherwise negative.     Physical Examination: Filed Vitals:   02/26/13 1543  BP: 112/72  Pulse: 66  Temp: 98.8 F (37.1 C)  Resp: 18   Filed Vitals:   02/26/13 1543  Height: 5' 4.5" (1.638 m)  Weight: 110 lb 6.4 oz (50.077 kg)   Body mass index is 18.66 kg/(m^2). Ideal Body Weight: Weight in (lb) to have BMI = 25: 147.6  GEN: WDWN, NAD, Non-toxic, A & O x 3 HEENT: Atraumatic, Normocephalic. Neck supple. No masses, No LAD. Ears and Nose: No external deformity. CV: RRR, No M/G/R. No JVD. No thrill. No extra heart sounds. PULM: CTA B, no wheezes, crackles, rhonchi. No retractions. No resp. distress. No accessory muscle use. ABD: S, NT, ND, +BS. No rebound. No HSM. EXTR: No c/c/e NEURO Normal gait.  PSYCH: Normally interactive. Conversant. Not depressed or anxious appearing.  Calm demeanor.    Assessment and Plan: Gastroenteritis zofran Imodium Clears   Signed,  Phillips Odor, MD   Results for orders placed  in visit on 02/26/13  POCT CBC      Result Value Range   WBC 5.5  4.6 - 10.2 K/uL   Lymph, poc 2.2  0.6 - 3.4   POC LYMPH PERCENT 39.1  10 - 50 %L   MID (cbc) 0.4  0 - 0.9   POC MID % 6.7  0 - 12 %M   POC Granulocyte 3.0  2 - 6.9   Granulocyte percent 54.2  37 - 80 %G   RBC 4.65  4.04 - 5.48 M/uL   Hemoglobin 12.3  12.2 - 16.2 g/dL   HCT, POC 40.9  81.1 - 47.9 %   MCV 86.2  80 - 97 fL   MCH, POC 26.5 (*) 27 - 31.2 pg   MCHC 30.7 (*) 31.8 - 35.4 g/dL   RDW, POC 91.4     Platelet Count, POC 253  142 - 424 K/uL   MPV 8.3  0 - 99.8 fL

## 2013-02-26 NOTE — Patient Instructions (Addendum)
Clear Liquid Diet  The clear liquid dietconsists of foods that are liquid or will become liquid at room temperature.You should be able to see through the liquid and beverages. Examples of foods allowed on a clear liquid diet include fruit juice, broth or bouillon, gelatin, or frozen ice pops.  The purpose of this diet is to provide necessary fluid, electrolytes such as sodium and potassium, and energy to keep the body functioning during times when you are not able to consume a regular diet.A clear liquid diet should not be continued for long periods of time as it is not nutritionally adequate.   REASONS FOR USING A CLEAR LIQUID DIET   In sudden onset (acute) conditions for a patient before or after surgery.   As the first step in oral feeding.   For fluid and electrolyte replacement in diarrheal diseases.   As a diet before certain medical tests are performed.  ADEQUACY  The clear liquid diet is adequate only in ascorbic acid, according to the Recommended Dietary Allowances of the National Research Council.  CHOOSING FOODS  Breads and Starches   Allowed:  None are allowed.   Avoid: All are avoided.  Vegetables   Allowed:  Strained tomato or vegetable juice.   Avoid: Any others.  Fruit   Allowed:  Strained fruit juices and fruit drinks. Include 1 serving of citrus or vitamin C-enriched fruit juice daily.   Avoid: Any others.  Meat and Meat Substitutes   Allowed:  None are allowed.   Avoid: All are avoided.  Milk   Allowed:  None are allowed.   Avoid: All are avoided.  Soups and Combination Foods   Allowed:  Clear bouillon, broth, or strained broth-based soups.   Avoid: Any others.  Desserts and Sweets   Allowed:  Sugar, honey. High protein gelatin. Flavored gelatin, ices, or frozen ice pops that do not contain milk.   Avoid: Any others.  Fats and Oils   Allowed:  None are allowed.   Avoid: All are avoided.  Beverages   Allowed: Cereal beverages, coffee (regular or decaffeinated), tea, or soda  at the discretion of your caregiver.   Avoid: Any others.  Condiments   Allowed:  Iodized salt.   Avoid: Any others, including pepper.  Supplements   Allowed:  Liquid nutrition beverages.   Avoid: Any others that contain lactose or fiber.  SAMPLE MEAL PLAN  Breakfast   4 oz (120 mL) strained orange juice.    to 1 cup (125 to 250 mL) gelatin (plain or fortified).   1 cup (250 mL) beverage (coffee or tea).   Sugar, if desired.  Midmorning Snack    cup (125 mL) gelatin (plain or fortified).  Lunch   1 cup (250 mL) broth or consomm.   4 oz (120 mL) strained grapefruit juice.    cup (125 mL) gelatin (plain or fortified).   1 cup (250 mL) beverage (coffee or tea).   Sugar, if desired.  Midafternoon Snack    cup (125 mL) fruit ice.    cup (125 mL) strained fruit juice.  Dinner   1 cup (250 mL) broth or consomm.    cup (125 mL) cranberry juice.    cup (125 mL) flavored gelatin (plain or fortified).   1 cup (250 mL) beverage (coffee or tea).   Sugar, if desired.  Evening Snack   4 oz (120 mL) strained apple juice (vitamin C-fortified).    cup (125 mL) flavored gelatin (plain or fortified).  Document 

## 2013-07-17 ENCOUNTER — Ambulatory Visit (INDEPENDENT_AMBULATORY_CARE_PROVIDER_SITE_OTHER): Payer: BC Managed Care – PPO | Admitting: Family Medicine

## 2013-07-17 VITALS — BP 110/80 | HR 73 | Temp 98.4°F | Resp 16 | Ht 65.0 in | Wt 108.2 lb

## 2013-07-17 DIAGNOSIS — R112 Nausea with vomiting, unspecified: Secondary | ICD-10-CM

## 2013-07-17 DIAGNOSIS — R197 Diarrhea, unspecified: Secondary | ICD-10-CM

## 2013-07-17 DIAGNOSIS — D72829 Elevated white blood cell count, unspecified: Secondary | ICD-10-CM

## 2013-07-17 LAB — POCT CBC
GRANULOCYTE PERCENT: 44.4 % (ref 37–80)
HEMATOCRIT: 41 % (ref 37.7–47.9)
Hemoglobin: 12.3 g/dL (ref 12.2–16.2)
LYMPH, POC: 1.4 (ref 0.6–3.4)
MCH, POC: 26.2 pg — AB (ref 27–31.2)
MCHC: 30 g/dL — AB (ref 31.8–35.4)
MCV: 87.5 fL (ref 80–97)
MID (cbc): 0.2 (ref 0–0.9)
MPV: 9.2 fL (ref 0–99.8)
PLATELET COUNT, POC: 243 10*3/uL (ref 142–424)
POC GRANULOCYTE: 1.3 — AB (ref 2–6.9)
POC LYMPH %: 47.5 % (ref 10–50)
POC MID %: 8.1 % (ref 0–12)
RBC: 4.69 M/uL (ref 4.04–5.48)
RDW, POC: 15.7 %
WBC: 3 10*3/uL — AB (ref 4.6–10.2)

## 2013-07-17 LAB — COMPREHENSIVE METABOLIC PANEL
ALBUMIN: 3.9 g/dL (ref 3.5–5.2)
ALK PHOS: 61 U/L (ref 39–117)
ALT: 9 U/L (ref 0–35)
AST: 15 U/L (ref 0–37)
BILIRUBIN TOTAL: 0.6 mg/dL (ref 0.3–1.2)
BUN: 11 mg/dL (ref 6–23)
CO2: 27 mEq/L (ref 19–32)
Calcium: 9.5 mg/dL (ref 8.4–10.5)
Chloride: 105 mEq/L (ref 96–112)
Creat: 0.92 mg/dL (ref 0.50–1.10)
GLUCOSE: 84 mg/dL (ref 70–99)
POTASSIUM: 4.1 meq/L (ref 3.5–5.3)
SODIUM: 139 meq/L (ref 135–145)
TOTAL PROTEIN: 6.8 g/dL (ref 6.0–8.3)

## 2013-07-17 LAB — GLUCOSE, POCT (MANUAL RESULT ENTRY): POC Glucose: 91 mg/dl (ref 70–99)

## 2013-07-17 LAB — POCT URINE PREGNANCY: Preg Test, Ur: NEGATIVE

## 2013-07-17 MED ORDER — DIPHENOXYLATE-ATROPINE 2.5-0.025 MG PO TABS: 1.0000 | ORAL_TABLET | Freq: Four times a day (QID) | ORAL | Status: DC | PRN

## 2013-07-17 MED ORDER — ONDANSETRON 8 MG PO TBDP: 8.0000 mg | ORAL_TABLET | Freq: Three times a day (TID) | ORAL | Status: DC | PRN

## 2013-07-17 NOTE — Progress Notes (Addendum)
[facility] [address], [place] [id number] [phone]  Date:  07/17/2013   Name:  Kathy FortesManasha J Sharp   DOB:  07/29/1975   MRN:  [id number]  PCP:  No PCP Per Patient    Chief Complaint: Diarrhea, Nausea and Fatigue   History of Present Illness:  Kathy FortesManasha J Koontz is a 38 y.o. very pleasant female patient who presents with the following:  She is here today with illness.  Today is Friday.  Since this past Tuesday she has been ill with nausea and vomiting. She also has diarrhea. She had a similar illness in August and got better with zofran.  She had some leftover zofran which she has used some as needed.  Right now she feels tired and "weak" from not eating.   No vomiting yet today.  She feels like she could make herself eat today but has not yet tried.  She did eat a biscuit yesterday.  Dry heaves yesterday only. She is drinking some liquids.   Her diarrhea is now better- none yesterday or today so far.   She does not have abdominal pain but her abs are sore from vomiting.   No blood in stool or vomitus  She has not noted a temperature and she has checked. She is not aware of any suspicious foods.  Her daughter has been ok  She works at a [employer].    She is generally healthy.  She has her menses now.    Wt Readings from Last 3 Encounters:  07/17/13 108 lb 3.2 oz (49.079 kg)  02/26/13 110 lb 6.4 oz (50.077 kg)  01/11/13 109 lb 12.8 oz (49.805 kg)   Her mother did pass away in December and she has been stressed There are no active problems to display for this patient.   No past medical history on file.  No past surgical history on file.  History  Substance Use Topics  . Smoking status: Never Smoker   . Smokeless tobacco: Never Used  . Alcohol Use: No    Family History  Problem Relation Age of Onset  . Cancer Mother     Allergies  Allergen Reactions  . Penicillins Rash    Reaction as a baby-hives, but she has taken cephalosporins without any  problem.    Medication list has been reviewed and updated.  Current Outpatient Prescriptions on File Prior to Visit  Medication Sig Dispense Refill  . EPINEPHrine (EPI-PEN) 0.3 mg/0.3 mL DEVI Inject 0.3 mLs (0.3 mg total) into the muscle once. In event of severe allergic reaction.  1 Device  0  . levonorgestrel-ethinyl estradiol (AVIANE,ALESSE,LESSINA) 0.1-20 MG-MCG tablet Take 1 tablet by mouth daily.  3 Package  3  . ondansetron (ZOFRAN-ODT) 8 MG disintegrating tablet Take 1 tablet (8 mg total) by mouth every 8 (eight) hours as needed for nausea.  30 tablet  0   No current facility-administered medications on file prior to visit.    Review of Systems:  As per HPI- otherwise negative.   Physical Examination: Filed Vitals:   07/17/13 0938  BP: 110/80  Pulse: 73  Temp: 98.4 F (36.9 C)  Resp: 16   Filed Vitals:   07/17/13 0938  Height: 5\' 5"  (1.651 m)  Weight: 108 lb 3.2 oz (49.079 kg)   Body mass index is 18.01 kg/(m^2). Ideal Body Weight: Weight in (lb) to have BMI = 25: 149.9  GEN: WDWN, NAD, Non-toxic, A & O x 3, looks well HEENT: Atraumatic,  Normocephalic. Neck supple. No masses, No LAD.  Bilateral TM wnl, oropharynx normal.  PEERL,EOMI.   Ears and Nose: No external deformity. CV: RRR, No M/G/R. No JVD. No thrill. No extra heart sounds. PULM: CTA B, no wheezes, crackles, rhonchi. No retractions. No resp. distress. No accessory muscle use. ABD: S,  ND, +BS. No rebound. No HSM.  Mild tenderness over her whole abdomen which seems due to muscle soreness  EXTR: No c/c/e NEURO Normal gait.  PSYCH: Normally interactive. Conversant. Not depressed or anxious appearing.  Calm demeanor.   Results for orders placed in visit on 07/17/13  POCT CBC      Result Value Range   WBC 3.0 (*) 4.6 - 10.2 K/uL   Lymph, poc 1.4  0.6 - 3.4   POC LYMPH PERCENT 47.5  10 - 50 %L   MID (cbc) 0.2  0 - 0.9   POC MID % 8.1  0 - 12 %M   POC Granulocyte 1.3 (*) 2 - 6.9   Granulocyte percent  44.4  37 - 80 %G   RBC 4.69  4.04 - 5.48 M/uL   Hemoglobin 12.3  12.2 - 16.2 g/dL   HCT, POC 16.1  09.6 - 47.9 %   MCV 87.5  80 - 97 fL   MCH, POC 26.2 (*) 27 - 31.2 pg   MCHC 30.0 (*) 31.8 - 35.4 g/dL   RDW, POC 04.5     Platelet Count, POC 243  142 - 424 K/uL   MPV 9.2  0 - 99.8 fL  POCT URINE PREGNANCY      Result Value Range   Preg Test, Ur Negative    GLUCOSE, POCT (MANUAL RESULT ENTRY)      Result Value Range   POC Glucose 91  70 - 99 mg/dl    Assessment and Plan: Nausea with vomiting - Plan: ondansetron (ZOFRAN-ODT) 8 MG disintegrating tablet, POCT CBC, POCT urine pregnancy, POCT glucose (manual entry)  Diarrhea - Plan: Comprehensive metabolic panel, diphenoxylate-atropine (LOMOTIL) 2.5-0.025 MG per tablet  Likely viral GI illness  See patient instructions for more details.     Signed Abbe Amsterdam, MD  1/17: called and LMOM.  CMP looks ok.  I would like to recheck her CBC in a month or so, can do as lab visit only.

## 2013-07-17 NOTE — Patient Instructions (Signed)
Rest and drink fluids.  Try to eat some bland foods such as bananas, crackers and applesauce.  You might try some imodium OTC for your diarrhea.  If this is not enough you can fill and use the lomotil rx; remember this can make you feel drowsy.    Use the zofran as needed for nausea and vomiting  If you are not feeling better in the next couple of days please let me know-Sooner if worse.

## 2013-07-18 ENCOUNTER — Encounter: Payer: Self-pay | Admitting: Family Medicine

## 2013-07-18 NOTE — Addendum Note (Signed)
Addended by: Abbe AmsterdamOPLAND, JESSICA C on: 07/18/2013 06:28 PM   Modules accepted: Orders

## 2013-09-17 ENCOUNTER — Ambulatory Visit (INDEPENDENT_AMBULATORY_CARE_PROVIDER_SITE_OTHER): Payer: BC Managed Care – PPO | Admitting: Physician Assistant

## 2013-09-17 VITALS — BP 110/72 | HR 73 | Temp 98.3°F | Resp 18 | Ht 64.0 in | Wt 111.0 lb

## 2013-09-17 DIAGNOSIS — R82998 Other abnormal findings in urine: Secondary | ICD-10-CM

## 2013-09-17 DIAGNOSIS — R8290 Unspecified abnormal findings in urine: Secondary | ICD-10-CM

## 2013-09-17 DIAGNOSIS — R3915 Urgency of urination: Secondary | ICD-10-CM

## 2013-09-17 DIAGNOSIS — N390 Urinary tract infection, site not specified: Secondary | ICD-10-CM

## 2013-09-17 LAB — POCT UA - MICROSCOPIC ONLY
Casts, Ur, LPF, POC: NEGATIVE
Crystals, Ur, HPF, POC: NEGATIVE
RBC, urine, microscopic: NEGATIVE
Yeast, UA: NEGATIVE

## 2013-09-17 LAB — POCT URINALYSIS DIPSTICK
Bilirubin, UA: NEGATIVE
Blood, UA: NEGATIVE
Glucose, UA: NEGATIVE
Ketones, UA: NEGATIVE
Leukocytes, UA: NEGATIVE
Nitrite, UA: POSITIVE
Protein, UA: NEGATIVE
Spec Grav, UA: >=1.03
Urobilinogen, UA: 0.2
pH, UA: 5.5

## 2013-09-17 MED ORDER — LEVONORGESTREL-ETHINYL ESTRAD 0.1-20 MG-MCG PO TABS: 1.0000 | ORAL_TABLET | Freq: Every day | ORAL | Status: DC

## 2013-09-17 MED ORDER — CIPROFLOXACIN HCL 250 MG PO TABS: 250.0000 mg | ORAL_TABLET | Freq: Two times a day (BID) | ORAL | Status: DC

## 2013-09-17 NOTE — Progress Notes (Signed)
   Subjective:    Patient ID: Kathy Sharp, female    DOB: 17-Sep-1975, 38 y.o.   MRN: [id number]  HPI 38 year old female presents for evaluation of urinary frequency and bad odor x 3 weeks.  Admits to some suprapubic pressure as well. No dysuria, flank pain, nausea, vomiting, fever, chills, vaginal discharge, or pelvic pain.  Hx of UTI's in the past with last episode "years" ago.  She is sexually active but has no concerns about STI's today and does not wish to be tested.  Patient is otherwise healthy with no other concerns today.     Review of Systems  Constitutional: Negative for fever and chills.  Gastrointestinal: Negative for nausea, vomiting and abdominal pain.  Genitourinary: Positive for frequency. Negative for dysuria, vaginal bleeding and vaginal discharge.       Objective:   Physical Exam  Constitutional: She is oriented to person, place, and time. She appears well-developed and well-nourished.  HENT:  Head: Normocephalic and atraumatic.  Right Ear: External ear normal.  Eyes: Conjunctivae are normal.  Neck: Normal range of motion.  Cardiovascular: Normal rate, regular rhythm and normal heart sounds.   Pulmonary/Chest: Effort normal.  Abdominal: Soft. Normal appearance. There is no tenderness. There is no rebound, no guarding and no CVA tenderness.  Neurological: She is alert and oriented to person, place, and time.  Psychiatric: She has a normal mood and affect. Her behavior is normal. Judgment and thought content normal.     Results for orders placed in visit on 09/17/13  POCT UA - MICROSCOPIC ONLY      Result Value Ref Range   WBC, Ur, HPF, POC 4-10     RBC, urine, microscopic neg     Bacteria, U Microscopic 4+     Mucus, UA moderate     Epithelial cells, urine per micros 0-3     Crystals, Ur, HPF, POC neg     Casts, Ur, LPF, POC neg     Yeast, UA neg    POCT URINALYSIS DIPSTICK      Result Value Ref Range   Color, UA yellow     Clarity, UA cloudy     Glucose, UA neg     Bilirubin, UA neg     Ketones, UA neg     Spec Grav, UA >=1.030     Blood, UA neg     pH, UA 5.5     Protein, UA neg     Urobilinogen, UA 0.2     Nitrite, UA positive     Leukocytes, UA Negative          Assessment & Plan:  Bad odor of urine - Plan: POCT UA - Microscopic Only, POCT urinalysis dipstick  Urgency of urination - Plan: POCT UA - Microscopic Only, POCT urinalysis dipstick  UTI (urinary tract infection) - Plan: Urine culture, ciprofloxacin (CIPRO) 250 MG tablet  Urinalysis suspicious for UTI. Will treat as such with Cipro 250 mg bid x 5 days Declined pyridium. Push fluids Urine culture pending Follow up if symptoms worsening or fail to improve.

## 2013-09-19 LAB — URINE CULTURE: Colony Count: >=100000

## 2013-10-17 ENCOUNTER — Ambulatory Visit (INDEPENDENT_AMBULATORY_CARE_PROVIDER_SITE_OTHER): Payer: BC Managed Care – PPO | Admitting: Family Medicine

## 2013-10-17 VITALS — BP 100/70 | HR 79 | Temp 98.9°F | Resp 18 | Ht 64.0 in | Wt 112.0 lb

## 2013-10-17 DIAGNOSIS — K5289 Other specified noninfective gastroenteritis and colitis: Secondary | ICD-10-CM

## 2013-10-17 DIAGNOSIS — R112 Nausea with vomiting, unspecified: Secondary | ICD-10-CM

## 2013-10-17 DIAGNOSIS — K529 Noninfective gastroenteritis and colitis, unspecified: Secondary | ICD-10-CM

## 2013-10-17 DIAGNOSIS — R109 Unspecified abdominal pain: Secondary | ICD-10-CM

## 2013-10-17 DIAGNOSIS — R197 Diarrhea, unspecified: Secondary | ICD-10-CM

## 2013-10-17 LAB — POCT CBC
Granulocyte percent: 42.3 %G (ref 37–80)
HCT, POC: 41 % (ref 37.7–47.9)
Hemoglobin: 12.8 g/dL (ref 12.2–16.2)
Lymph, poc: 2 (ref 0.6–3.4)
MCH, POC: 27.2 pg (ref 27–31.2)
MCHC: 31.2 g/dL — AB (ref 31.8–35.4)
MCV: 87 fL (ref 80–97)
MID (cbc): 0.4 (ref 0–0.9)
MPV: 9.3 fL (ref 0–99.8)
POC Granulocyte: 1.7 — AB (ref 2–6.9)
POC LYMPH PERCENT: 49 %L (ref 10–50)
POC MID %: 8.7 % (ref 0–12)
Platelet Count, POC: 218 10*3/uL (ref 142–424)
RBC: 4.71 M/uL (ref 4.04–5.48)
RDW, POC: 15.4 %
WBC: 4.1 10*3/uL — AB (ref 4.6–10.2)

## 2013-10-17 NOTE — Patient Instructions (Signed)
Diet for Diarrhea, Adult °Frequent, runny stools (diarrhea) may be caused or worsened by food or drink. Diarrhea may be relieved by changing your diet. Since diarrhea can last up to 7 days, it is easy for you to lose too much fluid from the body and become dehydrated. Fluids that are lost need to be replaced. Along with a modified diet, make sure you drink enough fluids to keep your urine clear or pale yellow. °DIET INSTRUCTIONS °· Ensure adequate fluid intake (hydration): have 1 cup (8 oz) of fluid for each diarrhea episode. Avoid fluids that contain simple sugars or sports drinks, fruit juices, whole milk products, and sodas. Your urine should be clear or pale yellow if you are drinking enough fluids. Hydrate with an oral rehydration solution that you can purchase at pharmacies, retail stores, and online. You can prepare an oral rehydration solution at home by mixing the following ingredients together: °·   tsp table salt. °· ¾ tsp baking soda. °·  tsp salt substitute containing potassium chloride. °· 1  tablespoons sugar. °· 1 L (34 oz) of water. °· Certain foods and beverages may increase the speed at which food moves through the gastrointestinal (GI) tract. These foods and beverages should be avoided and include: °· Caffeinated and alcoholic beverages. °· High-fiber foods, such as raw fruits and vegetables, nuts, seeds, and whole grain breads and cereals. °· Foods and beverages sweetened with sugar alcohols, such as xylitol, sorbitol, and mannitol. °· Some foods may be well tolerated and may help thicken stool including: °· Starchy foods, such as rice, toast, pasta, low-sugar cereal, oatmeal, grits, baked potatoes, crackers, and bagels.   °· Bananas.   °· Applesauce. °· Add probiotic-rich foods to help increase healthy bacteria in the GI tract, such as yogurt and fermented milk products. °RECOMMENDED FOODS AND BEVERAGES °Starches °Choose foods with less than 2 g of fiber per serving. °· Recommended:  White,  French, and pita breads, plain rolls, buns, bagels. Plain muffins, matzo. Soda, saltine, or graham crackers. Pretzels, melba toast, zwieback. Cooked cereals made with water: cornmeal, farina, cream cereals. Dry cereals: refined corn, wheat, rice. Potatoes prepared any way without skins, refined macaroni, spaghetti, noodles, refined rice. °· Avoid:  Bread, rolls, or crackers made with whole wheat, multi-grains, rye, bran seeds, nuts, or coconut. Corn tortillas or taco shells. Cereals containing whole grains, multi-grains, bran, coconut, nuts, raisins. Cooked or dry oatmeal. Coarse wheat cereals, granola. Cereals advertised as "high-fiber." Potato skins. Whole grain pasta, wild or brown rice. Popcorn. Sweet potatoes, yams. Sweet rolls, doughnuts, waffles, pancakes, sweet breads. °Vegetables °· Recommended: Strained tomato and vegetable juices. Most well-cooked and canned vegetables without seeds. Fresh: Tender lettuce, cucumber without the skin, cabbage, spinach, bean sprouts. °· Avoid: Fresh, cooked, or canned: Artichokes, baked beans, beet greens, broccoli, Brussels sprouts, corn, kale, legumes, peas, sweet potatoes. Cooked: Green or red cabbage, spinach. Avoid large servings of any vegetables because vegetables shrink when cooked, and they contain more fiber per serving than fresh vegetables. °Fruit °· Recommended: Cooked or canned: Apricots, applesauce, cantaloupe, cherries, fruit cocktail, grapefruit, grapes, kiwi, mandarin oranges, peaches, pears, plums, watermelon. Fresh: Apples without skin, ripe banana, grapes, cantaloupe, cherries, grapefruit, peaches, oranges, plums. Keep servings limited to ½ cup or 1 piece. °· Avoid: Fresh: Apples with skin, apricots, mangoes, pears, raspberries, strawberries. Prune juice, stewed or dried prunes. Dried fruits, raisins, dates. Large servings of all fresh fruits. °Protein °· Recommended: Ground or well-cooked tender beef, ham, veal, lamb, pork, or poultry. Eggs. Fish,  oysters, shrimp,   lobster, other seafoods. Liver, organ meats. °· Avoid: Tough, fibrous meats with gristle. Peanut butter, smooth or chunky. Cheese, nuts, seeds, legumes, dried peas, beans, lentils. °Dairy °· Recommended: Yogurt, lactose-free milk, kefir, drinkable yogurt, buttermilk, soy milk, or plain hard cheese. °· Avoid: Milk, chocolate milk, beverages made with milk, such as milkshakes. °Soups °· Recommended: Bouillon, broth, or soups made from allowed foods. Any strained soup. °· Avoid: Soups made from vegetables that are not allowed, cream or milk-based soups. °Desserts and Sweets °· Recommended: Sugar-free gelatin, sugar-free frozen ice pops made without sugar alcohol. °· Avoid: Plain cakes and cookies, pie made with fruit, pudding, custard, cream pie. Gelatin, fruit, ice, sherbet, frozen ice pops. Ice cream, ice milk without nuts. Plain hard candy, honey, jelly, molasses, syrup, sugar, chocolate syrup, gumdrops, marshmallows. °Fats and Oils °· Recommended: Limit fats to less than 8 tsp per day. °· Avoid: Seeds, nuts, olives, avocados. Margarine, butter, cream, mayonnaise, salad oils, plain salad dressings. Plain gravy, crisp bacon without rind. °Beverages °· Recommended: Water, decaffeinated teas, oral rehydration solutions, sugar-free beverages not sweetened with sugar alcohols. °· Avoid: Fruit juices, caffeinated beverages (coffee, tea, soda), alcohol, sports drinks, or lemon-lime soda. °Condiments °· Recommended: Ketchup, mustard, horseradish, vinegar, cocoa powder. Spices in moderation: allspice, basil, bay leaves, celery powder or leaves, cinnamon, cumin powder, curry powder, ginger, mace, marjoram, onion or garlic powder, oregano, paprika, parsley flakes, ground pepper, rosemary, sage, savory, tarragon, thyme, turmeric. °· Avoid: Coconut, honey. °Document Released: 09/08/2003 Document Revised: 03/12/2012 Document Reviewed: 11/02/2011 °ExitCare® Patient Information ©2014 [employer]. ° °Viral  Gastroenteritis °Viral gastroenteritis is also known as stomach flu. This condition affects the stomach and intestinal tract. It can cause sudden diarrhea and vomiting. The illness typically lasts 3 to 8 days. Most people develop an immune response that eventually gets rid of the virus. While this natural response develops, the virus can make you quite ill. °CAUSES  °Many different viruses can cause gastroenteritis, such as rotavirus or noroviruses. You can catch one of these viruses by consuming contaminated food or water. You may also catch a virus by sharing utensils or other personal items with an infected person or by touching a contaminated surface. °SYMPTOMS  °The most common symptoms are diarrhea and vomiting. These problems can cause a severe loss of body fluids (dehydration) and a body salt (electrolyte) imbalance. Other symptoms may include: °· Fever. °· Headache. °· Fatigue. °· Abdominal pain. °DIAGNOSIS  °Your caregiver can usually diagnose viral gastroenteritis based on your symptoms and a physical exam. A stool sample may also be taken to test for the presence of viruses or other infections. °TREATMENT  °This illness typically goes away on its own. Treatments are aimed at rehydration. The most serious cases of viral gastroenteritis involve vomiting so severely that you are not able to keep fluids down. In these cases, fluids must be given through an intravenous line (IV). °HOME CARE INSTRUCTIONS  °· Drink enough fluids to keep your urine clear or pale yellow. Drink small amounts of fluids frequently and increase the amounts as tolerated. °· Ask your caregiver for specific rehydration instructions. °· Avoid: °· Foods high in sugar. °· Alcohol. °· Carbonated drinks. °· Tobacco. °· Juice. °· Caffeine drinks. °· Extremely hot or cold fluids. °· Fatty, greasy foods. °· Too much intake of anything at one time. °· Dairy products until 24 to 48 hours after diarrhea stops. °· You may consume probiotics.  Probiotics are active cultures of beneficial bacteria. They may lessen the amount and number   of diarrheal stools in adults. Probiotics can be found in yogurt with active cultures and in supplements. °· Wash your hands well to avoid spreading the virus. °· Only take over-the-counter or prescription medicines for pain, discomfort, or fever as directed by your caregiver. Do not give aspirin to children. Antidiarrheal medicines are not recommended. °· Ask your caregiver if you should continue to take your regular prescribed and over-the-counter medicines. °· Keep all follow-up appointments as directed by your caregiver. °SEEK IMMEDIATE MEDICAL CARE IF:  °· You are unable to keep fluids down. °· You do not urinate at least once every 6 to 8 hours. °· You develop shortness of breath. °· You notice blood in your stool or vomit. This may look like coffee grounds. °· You have abdominal pain that increases or is concentrated in one small area (localized). °· You have persistent vomiting or diarrhea. °· You have a fever. °· The patient is a child younger than 3 months, and he or she has a fever. °· The patient is a child older than 3 months, and he or she has a fever and persistent symptoms. °· The patient is a child older than 3 months, and he or she has a fever and symptoms suddenly get worse. °· The patient is a baby, and he or she has no tears when crying. °MAKE SURE YOU:  °· Understand these instructions. °· Will watch your condition. °· Will get help right away if you are not doing well or get worse. °Document Released: 06/18/2005 Document Revised: 09/10/2011 Document Reviewed: 04/04/2011 °ExitCare® Patient Information ©2014 [employer]. ° °

## 2013-10-17 NOTE — Progress Notes (Signed)
Chief Complaint:  Chief Complaint  Patient presents with  . Diarrhea  . Nausea    HPI: Kathy Sharp is a 38 y.o. female who is here for 5 day history of nonbloody diarrhea with abdominal cramps due to s diarrhea she has been passing a lot of gas. ON Wednesday she was having diarrhea and vomiting, she was using the bathroom all day Wednesday and Thursday. Yesterday she used the bathroom 5 x, today she feels her stomach is cramping . She has been taking lomotil  Twice daily. She stopped doing this on Thursday sicne it made her stomach worse. She has been taking zofran. She cannot tolerate peptobismol. Denies fevers or chills. No new foods, no new meds, no recent travels. She has been ablet eat and has ahd appetite. Has not been following bland diet, attempted to eat french fries but could not. She has been able tot eat cantaloupe and states she feels good eating cold items. She has been eating whatever and not limit. Her diet. Hot tea goes through her.  She was put on cipro for UTI on   09/17/13,  she was given cipro x 5 days  No family hx of crohns/colitis, IBS  No past medical history on file. No past surgical history on file. History   Social History  . Marital Status: Single    Spouse Name: N/A    Number of Children: N/A  . Years of Education: N/A   Social History Main Topics  . Smoking status: Never Smoker   . Smokeless tobacco: Never Used  . Alcohol Use: No  . Drug Use: No  . Sexual Activity: Yes    Birth Control/ Protection: None   Other Topics Concern  . None   Social History Narrative  . None   Family History  Problem Relation Age of Onset  . Cancer Mother    Allergies  Allergen Reactions  . Penicillins Rash    Reaction as a baby-hives, but she has taken cephalosporins without any problem.   Prior to Admission medications   Medication Sig Start Date End Date Taking? Authorizing Provider  diphenoxylate-atropine (LOMOTIL) 2.5-0.025 MG per tablet Take 1  tablet by mouth 4 (four) times daily as needed for diarrhea or loose stools. 07/17/13  Yes Gwenlyn FoundJessica C Copland, MD  EPINEPHrine (EPI-PEN) 0.3 mg/0.3 mL DEVI Inject 0.3 mLs (0.3 mg total) into the muscle once. In event of severe allergic reaction. 02/12/12  Yes Anders SimmondsAngela M McClung, PA-C  levonorgestrel-ethinyl estradiol (AVIANE,ALESSE,LESSINA) 0.1-20 MG-MCG tablet Take 1 tablet by mouth daily. 09/17/13 09/17/14 Yes Heather M Marte, PA-C  ondansetron (ZOFRAN-ODT) 8 MG disintegrating tablet Take 1 tablet (8 mg total) by mouth every 8 (eight) hours as needed for nausea. 07/17/13  Yes Gwenlyn FoundJessica C Copland, MD     ROS: The patient denies fevers, chills, night sweats, unintentional weight loss, chest pain, palpitations, wheezing, dyspnea on exertion, dysuria, hematuria, melena, numbness, weakness, or tingling.   All other systems have been reviewed and were otherwise negative with the exception of those mentioned in the HPI and as above.    PHYSICAL EXAM: Filed Vitals:   10/17/13 1610  BP: 100/70  Pulse: 79  Temp: 98.9 F (37.2 C)  Resp: 18   Filed Vitals:   10/17/13 1610  Height: 5\' 4"  (1.626 m)  Weight: 112 lb (50.803 kg)   Body mass index is 19.22 kg/(m^2).  General: Alert, no acute distress HEENT:  Normocephalic, atraumatic, oropharynx patent. EOMI, PERRLA, oral  mucosa nl, not dry, TM nl, no exudates Cardiovascular:  Regular rate and rhythm, no rubs murmurs or gallops.  No Carotid bruits, radial pulse intact. No pedal edema.  Respiratory: Clear to auscultation bilaterally.  No wheezes, rales, or rhonchi.  No cyanosis, no use of accessory musculature GI: No organomegaly, abdomen is soft and non-tender, positive bowel sounds.  No masses. Skin: No rashes. Neurologic: Facial musculature symmetric. Psychiatric: Patient is appropriate throughout our interaction. Lymphatic: No cervical lymphadenopathy Musculoskeletal: Gait intact.   LABS: Results for orders placed in visit on 10/17/13  POCT CBC       Result Value Ref Range   WBC 4.1 (*) 4.6 - 10.2 K/uL   Lymph, poc 2.0  0.6 - 3.4   POC LYMPH PERCENT 49.0  10 - 50 %L   MID (cbc) 0.4  0 - 0.9   POC MID % 8.7  0 - 12 %M   POC Granulocyte 1.7 (*) 2 - 6.9   Granulocyte percent 42.3  37 - 80 %G   RBC 4.71  4.04 - 5.48 M/uL   Hemoglobin 12.8  12.2 - 16.2 g/dL   HCT, POC 86.541.0  78.437.7 - 47.9 %   MCV 87.0  80 - 97 fL   MCH, POC 27.2  27 - 31.2 pg   MCHC 31.2 (*) 31.8 - 35.4 g/dL   RDW, POC 69.615.4     Platelet Count, POC 218  142 - 424 K/uL   MPV 9.3  0 - 99.8 fL     EKG/XRAY:   [facility]   ASSESSMENT/PLAN: Encounter Diagnoses  Name Primary?  . Nausea with vomiting Yes  . Gastroenteritis   . Diarrhea   . Abdominal cramping    Most likely viral gastroenteritis but since has had 2 episode of GI bug and also recent abx will get stool cx, OP and c diff She will drop off stool sample when able Continue with Zofran BRAT diet Push fluids F/u prn  Gross sideeffects, risk and benefits, and alternatives of medications d/w patient. Patient is aware that all medications have potential sideeffects and we are unable to predict every sideeffect or drug-drug interaction that may occur.  Lenell Antuhao P Luqman Perrelli, DO 10/17/2013 6:51 PM

## 2013-10-18 LAB — COMPREHENSIVE METABOLIC PANEL WITH GFR
ALT: 11 U/L (ref 0–35)
AST: 18 U/L (ref 0–37)
Alkaline Phosphatase: 53 U/L (ref 39–117)
Sodium: 137 meq/L (ref 135–145)
Total Bilirubin: 0.6 mg/dL (ref 0.2–1.2)
Total Protein: 7.1 g/dL (ref 6.0–8.3)

## 2013-10-18 LAB — COMPREHENSIVE METABOLIC PANEL
Albumin: 4 g/dL (ref 3.5–5.2)
BUN: 17 mg/dL (ref 6–23)
CO2: 24 mEq/L (ref 19–32)
Calcium: 9.2 mg/dL (ref 8.4–10.5)
Chloride: 105 mEq/L (ref 96–112)
Creat: 0.86 mg/dL (ref 0.50–1.10)
Glucose, Bld: 86 mg/dL (ref 70–99)
Potassium: 4.3 mEq/L (ref 3.5–5.3)

## 2013-10-24 ENCOUNTER — Encounter: Payer: Self-pay | Admitting: Family Medicine

## 2013-12-30 ENCOUNTER — Ambulatory Visit (INDEPENDENT_AMBULATORY_CARE_PROVIDER_SITE_OTHER): Payer: BC Managed Care – PPO | Admitting: Family Medicine

## 2013-12-30 VITALS — BP 109/73 | HR 64 | Temp 98.3°F | Resp 16 | Ht 64.5 in | Wt 113.8 lb

## 2013-12-30 DIAGNOSIS — N898 Other specified noninflammatory disorders of vagina: Secondary | ICD-10-CM

## 2013-12-30 DIAGNOSIS — R52 Pain, unspecified: Secondary | ICD-10-CM

## 2013-12-30 DIAGNOSIS — N760 Acute vaginitis: Secondary | ICD-10-CM

## 2013-12-30 DIAGNOSIS — B9689 Other specified bacterial agents as the cause of diseases classified elsewhere: Secondary | ICD-10-CM

## 2013-12-30 DIAGNOSIS — R109 Unspecified abdominal pain: Secondary | ICD-10-CM

## 2013-12-30 DIAGNOSIS — R1030 Lower abdominal pain, unspecified: Secondary | ICD-10-CM

## 2013-12-30 DIAGNOSIS — N912 Amenorrhea, unspecified: Secondary | ICD-10-CM

## 2013-12-30 DIAGNOSIS — A499 Bacterial infection, unspecified: Secondary | ICD-10-CM

## 2013-12-30 LAB — POCT WET PREP WITH KOH
KOH PREP POC: NEGATIVE
TRICHOMONAS UA: NEGATIVE
YEAST WET PREP PER HPF POC: NEGATIVE

## 2013-12-30 LAB — POCT URINALYSIS DIPSTICK
Bilirubin, UA: NEGATIVE
Blood, UA: NEGATIVE
Glucose, UA: NEGATIVE
Ketones, UA: NEGATIVE
Leukocytes, UA: NEGATIVE
Nitrite, UA: NEGATIVE
Protein, UA: NEGATIVE
Spec Grav, UA: 1.015
UROBILINOGEN UA: 2
pH, UA: 7

## 2013-12-30 LAB — POCT URINE PREGNANCY: Preg Test, Ur: NEGATIVE

## 2013-12-30 LAB — POCT UA - MICROSCOPIC ONLY
Casts, Ur, LPF, POC: NEGATIVE
Crystals, Ur, HPF, POC: NEGATIVE
YEAST UA: NEGATIVE

## 2013-12-30 MED ORDER — METRONIDAZOLE 500 MG PO TABS: 500.0000 mg | ORAL_TABLET | Freq: Two times a day (BID) | ORAL | Status: DC

## 2013-12-30 MED ORDER — PRENATAL MULTIVITAMIN + DHA 28-0.8 & 200 MG PO MISC: ORAL | Status: DC

## 2013-12-30 NOTE — Patient Instructions (Signed)
Bacterial Vaginosis Bacterial vaginosis is a vaginal infection that occurs when the normal balance of bacteria in the vagina is disrupted. It results from an overgrowth of certain bacteria. This is the most common vaginal infection in women of childbearing age. Treatment is important to prevent complications, especially in pregnant women, as it can cause a premature delivery. CAUSES  Bacterial vaginosis is caused by an increase in harmful bacteria that are normally present in smaller amounts in the vagina. Several different kinds of bacteria can cause bacterial vaginosis. However, the reason that the condition develops is not fully understood. RISK FACTORS Certain activities or behaviors can put you at an increased risk of developing bacterial vaginosis, including:  Having a new sex partner or multiple sex partners.  Douching.  Using an intrauterine device (IUD) for contraception. Women do not get bacterial vaginosis from toilet seats, bedding, swimming pools, or contact with objects around them. SIGNS AND SYMPTOMS  Some women with bacterial vaginosis have no signs or symptoms. Common symptoms include:  Grey vaginal discharge.  A fishlike odor with discharge, especially after sexual intercourse.  Itching or burning of the vagina and vulva.  Burning or pain with urination. DIAGNOSIS  Your health care provider will take a medical history and examine the vagina for signs of bacterial vaginosis. A sample of vaginal fluid may be taken. Your health care provider will look at this sample under a microscope to check for bacteria and abnormal cells. A vaginal pH test may also be done.  TREATMENT  Bacterial vaginosis may be treated with antibiotic medicines. These may be given in the form of a pill or a vaginal cream. A second round of antibiotics may be prescribed if the condition comes back after treatment.  HOME CARE INSTRUCTIONS   Only take over-the-counter or prescription medicines as  directed by your health care provider.  If antibiotic medicine was prescribed, take it as directed. Make sure you finish it even if you start to feel better.  Do not have sex until treatment is completed.  Tell all sexual partners that you have a vaginal infection. They should see their health care provider and be treated if they have problems, such as a mild rash or itching.  Practice safe sex by using condoms and only having one sex partner. SEEK MEDICAL CARE IF:   Your symptoms are not improving after 3 days of treatment.  You have increased discharge or pain.  You have a fever. MAKE SURE YOU:   Understand these instructions.  Will watch your condition.  Will get help right away if you are not doing well or get worse. FOR MORE INFORMATION  Centers for Disease Control and Prevention, Division of STD Prevention: www.cdc.gov/std American Sexual Health Association (ASHA): www.ashastd.org  Document Released: 06/18/2005 Document Revised: 04/08/2013 Document Reviewed: 01/28/2013 Exit[employer] Patient Information 2015 [employer]. This information is not intended to replace advice given to you by your health care provider. Make sure you discuss any questions you have with your health care provider.  

## 2013-12-30 NOTE — Progress Notes (Signed)
Subjective:    Patient ID: Kathy Sharp, female    DOB: [birth year], [age] y.o.   MRN: [id number] This chart was scribed for Norberto SorensonEva Shaw, MD by Nicholos Johnsenise Iheanachor, Medical Scribe. The patient was seen in room 11. This patient's care was started at 5:59 PM.  Chief Complaint  Patient presents with  . Vaginal Discharge    noticed the discharge today just got off birth control trying to get pregnant     Vaginal Discharge The patient's primary symptoms include a vaginal discharge. Associated symptoms include abdominal pain.   HPI Comments: Kathy FortesManasha J Hornbaker is a 38 y.o. female w/ hx of bacterial infections presents to the [facility] complaining of clear, creamy vaginal discharge. Strong odor but states it is not fishy. Also reports some abdominal pain. Got off Alesse birth control pill beginning of June. Had 2 menstrual cycles in June; before and after getting off the pill. Last cycle was June 22; states it lasted 3 days. Is currently trying to get pregnant; currently has 5118 & 36 year old children. States she usually gets bacterial infections when her partner ejaculates inside her vagina during intercourse. States partner eats a lot of protein and vegetables as apart of his fitness training. Denies dysuria, and vaginal itching.   There are no active problems to display for this patient.  No past medical history on file. No past surgical history on file. Allergies  Allergen Reactions  . Penicillins Rash    Reaction as a baby-hives, but she has taken cephalosporins without any problem.   Prior to Admission medications   Medication Sig Start Date End Date Taking? Authorizing Provider  diphenoxylate-atropine (LOMOTIL) 2.5-0.025 MG per tablet Take 1 tablet by mouth 4 (four) times daily as needed for diarrhea or loose stools. 07/17/13  Yes Gwenlyn FoundJessica C Copland, MD  EPINEPHrine (EPI-PEN) 0.3 mg/0.3 mL DEVI Inject 0.3 mLs (0.3 mg total) into the muscle once. In event of severe allergic  reaction. 02/12/12  Yes Marzella SchleinAngela M McClung, PA-C  ondansetron (ZOFRAN-ODT) 8 MG disintegrating tablet Take 1 tablet (8 mg total) by mouth every 8 (eight) hours as needed for nausea. 07/17/13  Yes Pearline CablesJessica C Copland, MD   History   Social History  . Marital Status: Single    Spouse Name: N/A    Number of Children: N/A  . Years of Education: N/A   Occupational History  . Not on file.   Social History Main Topics  . Smoking status: Never Smoker   . Smokeless tobacco: Never Used  . Alcohol Use: No  . Drug Use: No  . Sexual Activity: Yes    Birth Control/ Protection: None   Other Topics Concern  . Not on file   Social History Narrative  . No narrative on file   Review of Systems  Gastrointestinal: Positive for abdominal pain.  Genitourinary: Positive for vaginal discharge.   Triage vitals: BP 109/73  Pulse 64  Temp(Src) 98.3 F (36.8 C) (Oral)  Resp 16  Ht 5' 4.5" (1.638 m)  Wt 113 lb 12.8 oz (51.619 kg)  BMI 19.24 kg/m2  SpO2 100%  LMP 12/21/2013   Objective:  Physical Exam  Vitals reviewed. Constitutional: She is oriented to person, place, and time. She appears well-developed and well-nourished. No distress.  HENT:  Head: Normocephalic and atraumatic.  Eyes: Conjunctivae and EOM are normal.  Neck: Neck supple. No tracheal deviation present.  Cardiovascular: Normal rate.   Pulmonary/Chest: Effort normal. No respiratory distress.  Genitourinary:  Pelvic exam was performed with patient supine. Vaginal discharge found.  Normal labia. Normal vagina. Moderate amount of thin yellow white vaginal discharge with amine odor. Cervix normal with nabothian cyst.   Musculoskeletal: Normal range of motion.  Neurological: She is alert and oriented to person, place, and time.  Skin: Skin is warm and dry.  Psychiatric: She has a normal mood and affect. Her behavior is normal.     Results for orders placed in visit on 12/30/13  POCT URINE PREGNANCY      Result Value Ref Range    Preg Test, Ur Negative    POCT URINALYSIS DIPSTICK      Result Value Ref Range   Color, UA yellow     Clarity, UA clear     Glucose, UA neg     Bilirubin, UA neg     Ketones, UA neg     Spec Grav, UA 1.015     Blood, UA neg     pH, UA 7.0     Protein, UA neg     Urobilinogen, UA 2.0     Nitrite, UA neg     Leukocytes, UA Negative    POCT UA - MICROSCOPIC ONLY      Result Value Ref Range   WBC, Ur, HPF, POC 0-2     RBC, urine, microscopic 0-1     Bacteria, U Microscopic trace     Mucus, UA 1+     Epithelial cells, urine per micros 0-2     Crystals, Ur, HPF, POC neg     Casts, Ur, LPF, POC neg     Yeast, UA neg    POCT WET PREP WITH KOH      Result Value Ref Range   Trichomonas, UA Negative     Clue Cells Wet Prep HPF POC 3-5     Epithelial Wet Prep HPF POC 0-2     Yeast Wet Prep HPF POC neg     Bacteria Wet Prep HPF POC 2+     RBC Wet Prep HPF POC 0-1     WBC Wet Prep HPF POC 3-5     KOH Prep POC Negative     Assessment & Plan:   Amenorrhea - Plan: POCT urine pregnancy  Vaginal discharge - Plan: POCT Wet Prep with KOH  Suprapubic pain, acute, unspecified laterality - Plan: POCT urinalysis dipstick, POCT UA - Microscopic Only  Bacterial vaginosis  Meds ordered this encounter  Medications  . metroNIDAZOLE (FLAGYL) 500 MG tablet    Sig: Take 1 tablet (500 mg total) by mouth 2 (two) times daily.    Dispense:  14 tablet    Refill:  0  . Prenatal MV-Min-Fe Fum-FA-DHA (PRENATAL MULTIVITAMIN + DHA) 28-0.8 & 200 MG MISC    Sig: Take 1 tab po qd    Dispense:  120 each    Refill:  11    OK to substitute any prenatal vitamin with at least 800mcg folic acid and some DHA or EPA, thanks    I personally performed the services described in this documentation, which was scribed in my presence. The recorded information has been reviewed and considered, and addended by me as needed.  Norberto SorensonEva Shaw, MD MPH

## 2014-02-06 ENCOUNTER — Ambulatory Visit (INDEPENDENT_AMBULATORY_CARE_PROVIDER_SITE_OTHER): Payer: BC Managed Care – PPO | Admitting: Physician Assistant

## 2014-02-06 VITALS — BP 110/66 | HR 92 | Temp 98.1°F | Resp 16 | Ht 64.0 in | Wt 112.8 lb

## 2014-02-06 DIAGNOSIS — Z3201 Encounter for pregnancy test, result positive: Secondary | ICD-10-CM

## 2014-02-06 DIAGNOSIS — N898 Other specified noninflammatory disorders of vagina: Secondary | ICD-10-CM

## 2014-02-06 DIAGNOSIS — N926 Irregular menstruation, unspecified: Secondary | ICD-10-CM

## 2014-02-06 LAB — POCT WET PREP WITH KOH
Clue Cells Wet Prep HPF POC: NEGATIVE
KOH Prep POC: NEGATIVE
RBC Wet Prep HPF POC: NEGATIVE
Trichomonas, UA: NEGATIVE
Yeast Wet Prep HPF POC: NEGATIVE

## 2014-02-06 LAB — POCT URINE PREGNANCY: Preg Test, Ur: POSITIVE

## 2014-02-06 MED ORDER — PRENATAL MULTIVITAMIN + DHA 28-0.8 & 200 MG PO MISC: ORAL | Status: DC

## 2014-02-06 NOTE — Progress Notes (Signed)
   Subjective:    Patient ID: Kathy Sharp, female    DOB: [birth year], 38 y.o.   MRN: [id number]  HPI  38 year old female presents for evaluation of 1 week history of vaginal discharge. She is frustrated because she "keeps getting BV."  Last seen on 7/1 and Flagyl x 7 days.  Admits symptoms improved and were "doing ok" until 1 week ago. Reports she used a warming lubricant that caused her to have some local inflammation and discomfort, but that resolved spontaneously.  She recently came of OCP's because she is trying to conceive.  She reports that she has started using condoms again because she thought that would help decrease the frequency of BV.   Denies odor, abdominal pain, dysuria, urinary frequency, nausea, or vomiting.  LNMP unknown.    Review of Systems  Constitutional: Negative for fever and chills.  Gastrointestinal: Negative for nausea, vomiting and abdominal pain.  Genitourinary: Positive for vaginal discharge. Negative for dysuria, frequency, vaginal pain and pelvic pain.       Objective:   Physical Exam  Constitutional: She is oriented to person, place, and time. She appears well-developed and well-nourished.  HENT:  Head: Normocephalic and atraumatic.  Right Ear: External ear normal.  Left Ear: External ear normal.  Eyes: Conjunctivae are normal.  Neck: Normal range of motion.  Cardiovascular: Normal rate.   Pulmonary/Chest: Effort normal.  Genitourinary: Vagina normal and uterus normal. Cervix exhibits discharge (thin, white). Cervix exhibits no motion tenderness and no friability. Right adnexum displays no mass, no tenderness and no fullness. Left adnexum displays no mass, no tenderness and no fullness.  Neurological: She is alert and oriented to person, place, and time.  Psychiatric: She has a normal mood and affect. Her behavior is normal. Judgment and thought content normal.    Results for orders placed in visit on 02/06/14  POCT URINE PREGNANCY      Result  Value Ref Range   Preg Test, Ur Positive    POCT WET PREP WITH KOH      Result Value Ref Range   Trichomonas, UA Negative     Clue Cells Wet Prep HPF POC neg     Epithelial Wet Prep HPF POC 1-6     Yeast Wet Prep HPF POC neg     Bacteria Wet Prep HPF POC trace     RBC Wet Prep HPF POC neg     WBC Wet Prep HPF POC 0-3     KOH Prep POC Negative           Assessment & Plan:   Leukorrhea, not specified as infective - Plan: POCT Wet Prep with KOH  Irregular menstrual cycle - Plan: POCT urine pregnancy  Positive pregnancy test  No evidence of BV today. Plan to use probiotics and conservative therapy Positive pregnancy test today - LNMP 12/21/13; estimated [redacted] weeks gestation Patient to make self referral to OB. Start prenatal vitamins daily.  No ETOH or cigarettes. Patient not on any regular medications.

## 2014-02-11 ENCOUNTER — Inpatient Hospital Stay (HOSPITAL_COMMUNITY)
Admission: AD | Admit: 2014-02-11 | Discharge: 2014-02-11 | Disposition: A | Discharge status: Home / Self Care | Payer: BC Managed Care – PPO | Source: Ambulatory Visit | Attending: Obstetrics & Gynecology | Admitting: Obstetrics & Gynecology

## 2014-02-11 ENCOUNTER — Encounter (HOSPITAL_COMMUNITY): Payer: Self-pay

## 2014-02-11 ENCOUNTER — Inpatient Hospital Stay (HOSPITAL_COMMUNITY): Payer: BC Managed Care – PPO

## 2014-02-11 DIAGNOSIS — N760 Acute vaginitis: Secondary | ICD-10-CM | POA: Insufficient documentation

## 2014-02-11 DIAGNOSIS — A499 Bacterial infection, unspecified: Secondary | ICD-10-CM | POA: Diagnosis not present

## 2014-02-11 DIAGNOSIS — Z880 Allergy status to penicillin: Secondary | ICD-10-CM | POA: Insufficient documentation

## 2014-02-11 DIAGNOSIS — O209 Hemorrhage in early pregnancy, unspecified: Secondary | ICD-10-CM | POA: Diagnosis not present

## 2014-02-11 DIAGNOSIS — O2390 Unspecified genitourinary tract infection in pregnancy, unspecified trimester: Secondary | ICD-10-CM | POA: Diagnosis not present

## 2014-02-11 DIAGNOSIS — R109 Unspecified abdominal pain: Secondary | ICD-10-CM | POA: Insufficient documentation

## 2014-02-11 DIAGNOSIS — B9689 Other specified bacterial agents as the cause of diseases classified elsewhere: Secondary | ICD-10-CM | POA: Diagnosis not present

## 2014-02-11 LAB — CBC WITH DIFFERENTIAL/PLATELET
BASOS PCT: 0 % (ref 0–1)
Basophils Absolute: 0 10*3/uL (ref 0.0–0.1)
EOS ABS: 0 10*3/uL (ref 0.0–0.7)
Eosinophils Relative: 0 % (ref 0–5)
HCT: 37.9 % (ref 36.0–46.0)
HEMOGLOBIN: 12.7 g/dL (ref 12.0–15.0)
Lymphocytes Relative: 49 % — ABNORMAL HIGH (ref 12–46)
Lymphs Abs: 2.5 10*3/uL (ref 0.7–4.0)
MCH: 28.8 pg (ref 26.0–34.0)
MCHC: 33.5 g/dL (ref 30.0–36.0)
MCV: 85.9 fL (ref 78.0–100.0)
MONOS PCT: 9 % (ref 3–12)
Monocytes Absolute: 0.5 10*3/uL (ref 0.1–1.0)
NEUTROS PCT: 42 % — AB (ref 43–77)
Neutro Abs: 2.2 10*3/uL (ref 1.7–7.7)
PLATELETS: 234 10*3/uL (ref 150–400)
RBC: 4.41 MIL/uL (ref 3.87–5.11)
RDW: 14.6 % (ref 11.5–15.5)
WBC: 5.2 10*3/uL (ref 4.0–10.5)

## 2014-02-11 LAB — URINALYSIS, ROUTINE W REFLEX MICROSCOPIC
BILIRUBIN URINE: NEGATIVE
GLUCOSE, UA: NEGATIVE mg/dL
Hgb urine dipstick: NEGATIVE
Ketones, ur: NEGATIVE mg/dL
LEUKOCYTES UA: NEGATIVE
Nitrite: NEGATIVE
Protein, ur: NEGATIVE mg/dL
Specific Gravity, Urine: 1.025 (ref 1.005–1.030)
Urobilinogen, UA: 0.2 mg/dL (ref 0.0–1.0)
pH: 6 (ref 5.0–8.0)

## 2014-02-11 LAB — WET PREP, GENITAL
TRICH WET PREP: NONE SEEN
Yeast Wet Prep HPF POC: NONE SEEN

## 2014-02-11 LAB — HCG, QUANTITATIVE, PREGNANCY: HCG, BETA CHAIN, QUANT, S: 683 m[IU]/mL — AB (ref <5)

## 2014-02-11 MED ORDER — METRONIDAZOLE 0.75 % VA GEL: 1.0000 | Freq: Two times a day (BID) | VAGINAL | Status: DC

## 2014-02-11 NOTE — MAU Note (Signed)
WENT  TO URGENT CARE IN 6-28- TOLD HER  SHE  HAD BV-  GAVE MEDS- TOOK ALL.  THEN  SHE WENT BACK SAT-  WITH SAME S/S-  D/C AND SMELL-  TOLD HER  THE WET PREP  WAS NEG   BUT POSITIVE   UPT.       NOW STILL HAS VAG D/C  WITH ODOR.-  ALL WORSE.    LAST SEX-  END OF July.     DR   NEWELL  DEL OTHER BABIES.      GOES TO URGENT  CARE FOR GYN CARE.

## 2014-02-11 NOTE — Discharge Instructions (Signed)
Bacterial Vaginosis °Bacterial vaginosis is a vaginal infection that occurs when the normal balance of bacteria in the vagina is disrupted. It results from an overgrowth of certain bacteria. This is the most common vaginal infection in women of childbearing age. Treatment is important to prevent complications, especially in pregnant women, as it can cause a premature delivery. °CAUSES  °Bacterial vaginosis is caused by an increase in harmful bacteria that are normally present in smaller amounts in the vagina. Several different kinds of bacteria can cause bacterial vaginosis. However, the reason that the condition develops is not fully understood. °RISK FACTORS °Certain activities or behaviors can put you at an increased risk of developing bacterial vaginosis, including: °· Having a new sex partner or multiple sex partners. °· Douching. °· Using an intrauterine device (IUD) for contraception. °Women do not get bacterial vaginosis from toilet seats, bedding, swimming pools, or contact with objects around them. °SIGNS AND SYMPTOMS  °Some women with bacterial vaginosis have no signs or symptoms. Common symptoms include: °· Grey vaginal discharge. °· A fishlike odor with discharge, especially after sexual intercourse. °· Itching or burning of the vagina and vulva. °· Burning or pain with urination. °DIAGNOSIS  °Your health care provider will take a medical history and examine the vagina for signs of bacterial vaginosis. A sample of vaginal fluid may be taken. Your health care provider will look at this sample under a microscope to check for bacteria and abnormal cells. A vaginal pH test may also be done.  °TREATMENT  °Bacterial vaginosis may be treated with antibiotic medicines. These may be given in the form of a pill or a vaginal cream. A second round of antibiotics may be prescribed if the condition comes back after treatment.  °HOME CARE INSTRUCTIONS  °· Only take over-the-counter or prescription medicines as  directed by your health care provider. °· If antibiotic medicine was prescribed, take it as directed. Make sure you finish it even if you start to feel better. °· Do not have sex until treatment is completed. °· Tell all sexual partners that you have a vaginal infection. They should see their health care provider and be treated if they have problems, such as a mild rash or itching. °· Practice safe sex by using condoms and only having one sex partner. °SEEK MEDICAL CARE IF:  °· Your symptoms are not improving after 3 days of treatment. °· You have increased discharge or pain. °· You have a fever. °MAKE SURE YOU:  °· Understand these instructions. °· Will watch your condition. °· Will get help right away if you are not doing well or get worse. °FOR MORE INFORMATION  °Centers for Disease Control and Prevention, Division of STD Prevention: www.cdc.gov/std °American Sexual Health Association (ASHA): www.ashastd.org  °Document Released: 06/18/2005 Document Revised: 04/08/2013 Document Reviewed: 01/28/2013 °Exit[employer]® Patient Information ©2015 [employer]. This information is not intended to replace advice given to you by your health care provider. Make sure you discuss any questions you have with your health care provider. °First Trimester of Pregnancy °The first trimester of pregnancy is from week 1 until the end of week 12 (months 1 through 3). During this time, your baby will begin to develop inside you. At 6-8 weeks, the eyes and face are formed, and the heartbeat can be seen on ultrasound. At the end of 12 weeks, all the baby's organs are formed. Prenatal care is all the medical care you receive before the birth of your baby. Make sure you get good prenatal care   and follow all of your doctor's instructions. °HOME CARE  °Medicines °· Take medicine only as told by your doctor. Some medicines are safe and some are not during pregnancy. °· Take your prenatal vitamins as told by your doctor. °· Take medicine that helps  you poop (stool softener) as needed if your doctor says it is okay. °Diet °· Eat regular, healthy meals. °· Your doctor will tell you the amount of weight gain that is right for you. °· Avoid raw meat and uncooked cheese. °· If you feel sick to your stomach (nauseous) or throw up (vomit): °¨ Eat 4 or 5 small meals a day instead of 3 large meals. °¨ Try eating a few soda crackers. °¨ Drink liquids between meals instead of during meals. °· If you have a hard time pooping (constipation): °¨ Eat high-fiber foods like fresh vegetables, fruit, and whole grains. °¨ Drink enough fluids to keep your pee (urine) clear or pale yellow. °Activity and Exercise °· Exercise only as told by your doctor. Stop exercising if you have cramps or pain in your lower belly (abdomen) or low back. °· Try to avoid standing for long periods of time. Move your legs often if you must stand in one place for a long time. °· Avoid heavy lifting. °· Wear low-heeled shoes. Sit and stand up straight. °· You can have sex unless your doctor tells you not to. °Relief of Pain or Discomfort °· Wear a good support bra if your breasts are sore. °· Take warm water baths (sitz baths) to soothe pain or discomfort caused by hemorrhoids. Use hemorrhoid cream if your doctor says it is okay. °· Rest with your legs raised if you have leg cramps or low back pain. °· Wear support hose if you have puffy, bulging veins (varicose veins) in your legs. Raise (elevate) your feet for 15 minutes, 3-4 times a day. Limit salt in your diet. °Prenatal Care °· Schedule your prenatal visits by the twelfth week of pregnancy. °· Write down your questions. Take them to your prenatal visits. °· Keep all your prenatal visits as told by your doctor. °Safety °· Wear your seat belt at all times when driving. °· Make a list of emergency phone numbers. The list should include numbers for family, friends, the hospital, and police and fire departments. °General Tips °· Ask your doctor for a  referral to a local prenatal class. Begin classes no later than at the start of month 6 of your pregnancy. °· Ask for help if you need counseling or help with nutrition. Your doctor can give you advice or tell you where to go for help. °· Do not use hot tubs, steam rooms, or saunas. °· Do not douche or use tampons or scented sanitary pads. °· Do not cross your legs for long periods of time. °· Avoid litter boxes and soil used by cats. °· Avoid all smoking, herbs, and alcohol. Avoid drugs not approved by your doctor. °· Visit your dentist. At home, brush your teeth with a soft toothbrush. Be gentle when you floss. °GET HELP IF: °· You are dizzy. °· You have mild cramps or pressure in your lower belly. °· You have a nagging pain in your belly area. °· You continue to feel sick to your stomach, throw up, or have watery poop (diarrhea). °· You have a bad smelling fluid coming from your vagina. °· You have pain with peeing (urination). °· You have increased puffiness (swelling) in your face, hands, legs, or ankles. °  GET HELP RIGHT AWAY IF:  °· You have a fever. °· You are leaking fluid from your vagina. °· You have spotting or bleeding from your vagina. °· You have very bad belly cramping or pain. °· You gain or lose weight rapidly. °· You throw up blood. It may look like coffee grounds. °· You are around people who have German measles, fifth disease, or chickenpox. °· You have a very bad headache. °· You have shortness of breath. °· You have any kind of trauma, such as from a fall or a car accident. °Document Released: 12/05/2007 Document Revised: 11/02/2013 Document Reviewed: 04/28/2013 °ExitCare® Patient Information ©2015 [employer]. This information is not intended to replace advice given to you by your health care provider. Make sure you discuss any questions you have with your health care provider. ° °

## 2014-02-11 NOTE — MAU Provider Note (Signed)
History     CSN: [id number]  Arrival date and time: 02/11/14 2004   First Provider Initiated Contact with Patient 02/11/14 2046      Chief Complaint  Patient presents with  . Vaginal Discharge  . Abdominal Cramping   HPI Kathy Sharp is a 38 y.o. G3P2002 at 6521w3d who presents to MAU today with complaint of vaginal discharge and abdominal cramping. The patient was seen at Urgent Care on 02/06/14 and had +UPT. She states that discharge has worsened and is brown with foul odor. She states that abdominal pain is diffuse and feels "like gas." She denies fever, N/V/D or UTI symptoms.   OB History   Grav Para Term Preterm Abortions TAB SAB Ect Mult Living   3 2 2       2       History reviewed. No pertinent past medical history.  History reviewed. No pertinent past surgical history.  Family History  Problem Relation Age of Onset  . Cancer Mother     History  Substance Use Topics  . Smoking status: Never Smoker   . Smokeless tobacco: Never Used  . Alcohol Use: Yes     Comment: OCC    Allergies:  Allergies  Allergen Reactions  . Penicillins Rash and Other (See Comments)    Reaction as a baby-hives, but she has taken cephalosporins without any problem.    Prescriptions prior to admission  Medication Sig Dispense Refill  . Docosahexaenoic Acid (DHA PO) Take 1 each by mouth daily.      . Prenatal Vit-Fe Fumarate-FA (PRENATAL MULTIVITAMIN) TABS tablet Take 1 tablet by mouth daily.      Marland Kitchen. EPINEPHrine (EPI-PEN) 0.3 mg/0.3 mL DEVI Inject 0.3 mLs (0.3 mg total) into the muscle once. In event of severe allergic reaction.  1 Device  0    Review of Systems  Constitutional: Negative for fever and malaise/fatigue.  Gastrointestinal: Positive for abdominal pain. Negative for nausea, vomiting, diarrhea and constipation.  Genitourinary: Negative for dysuria, urgency and frequency.       + vaginal bleeding, discharge   Physical Exam   Blood pressure 115/87, pulse 82, temperature  99.1 F (37.3 C), temperature source Oral, resp. rate 16, height 5\' 4"  (1.626 m), weight 113 lb 9.6 oz (51.529 kg), last menstrual period 01/11/2014, SpO2 100.00%.  Physical Exam  Constitutional: She is oriented to person, place, and time. She appears well-developed and well-nourished. No distress.  HENT:  Head: Normocephalic.  Cardiovascular: Normal rate.   Respiratory: Effort normal.  GI: Soft. Bowel sounds are normal. She exhibits no distension and no mass. There is tenderness (mild diffuse tenderness to palpation of the abdomen). There is no rebound and no guarding.  Genitourinary: Uterus is enlarged (slightly). Uterus is not tender. Cervix exhibits no motion tenderness, no discharge and no friability. Right adnexum displays no mass and no tenderness. Left adnexum displays no mass and no tenderness. There is bleeding (scant) around the vagina. Vaginal discharge (scant thin, white discharge noted) found.  Neurological: She is alert and oriented to person, place, and time.  Skin: Skin is warm and dry. No erythema.  Psychiatric: She has a normal mood and affect.   Results for orders placed during the hospital encounter of 02/11/14 (from the past 24 hour(s))  URINALYSIS, ROUTINE W REFLEX MICROSCOPIC     Status: None   Collection Time    02/11/14  8:28 PM      Result Value Ref Range   Color, Urine YELLOW  YELLOW   APPearance CLEAR  CLEAR   Specific Gravity, Urine 1.025  1.005 - 1.030   pH 6.0  5.0 - 8.0   Glucose, UA NEGATIVE  NEGATIVE mg/dL   Hgb urine dipstick NEGATIVE  NEGATIVE   Bilirubin Urine NEGATIVE  NEGATIVE   Ketones, ur NEGATIVE  NEGATIVE mg/dL   Protein, ur NEGATIVE  NEGATIVE mg/dL   Urobilinogen, UA 0.2  0.0 - 1.0 mg/dL   Nitrite NEGATIVE  NEGATIVE   Leukocytes, UA NEGATIVE  NEGATIVE  CBC WITH DIFFERENTIAL     Status: Abnormal   Collection Time    02/11/14  9:00 PM      Result Value Ref Range   WBC 5.2  4.0 - 10.5 K/uL   RBC 4.41  3.87 - 5.11 MIL/uL   Hemoglobin 12.7   12.0 - 15.0 g/dL   HCT 40.9  81.1 - 91.4 %   MCV 85.9  78.0 - 100.0 fL   MCH 28.8  26.0 - 34.0 pg   MCHC 33.5  30.0 - 36.0 g/dL   RDW 78.2  95.6 - 21.3 %   Platelets 234  150 - 400 K/uL   Neutrophils Relative % 42 (*) 43 - 77 %   Neutro Abs 2.2  1.7 - 7.7 K/uL   Lymphocytes Relative 49 (*) 12 - 46 %   Lymphs Abs 2.5  0.7 - 4.0 K/uL   Monocytes Relative 9  3 - 12 %   Monocytes Absolute 0.5  0.1 - 1.0 K/uL   Eosinophils Relative 0  0 - 5 %   Eosinophils Absolute 0.0  0.0 - 0.7 K/uL   Basophils Relative 0  0 - 1 %   Basophils Absolute 0.0  0.0 - 0.1 K/uL  ABO/RH     Status: None   Collection Time    02/11/14  9:00 PM      Result Value Ref Range   ABO/RH(D) O POS    HCG, QUANTITATIVE, PREGNANCY     Status: Abnormal   Collection Time    02/11/14  9:00 PM      Result Value Ref Range   hCG, Beta Chain, Quant, S 683 (*) <5 mIU/mL  WET PREP, GENITAL     Status: Abnormal   Collection Time    02/11/14  9:10 PM      Result Value Ref Range   Yeast Wet Prep HPF POC NONE SEEN  NONE SEEN   Trich, Wet Prep NONE SEEN  NONE SEEN   Clue Cells Wet Prep HPF POC MODERATE (*) NONE SEEN   WBC, Wet Prep HPF POC FEW (*) NONE SEEN   US Ob Comp Less 14 Wks  02/11/2014   CLINICAL DATA:  Pelvic cramping for 2 weeks.  Vaginal discharge.  EXAM: OBSTETRIC <14 WK [place] AND TRANSVAGINAL OB US  TECHNIQUE: Both transabdominal and transvaginal ultrasound examinations were performed for complete evaluation of the gestation as well as the maternal uterus, adnexal regions, and pelvic cul-de-sac. Transvaginal technique was performed to assess early pregnancy.  COMPARISON:  None.  FINDINGS: Intrauterine gestational sac:  None seen.  Yolk sac:  N/A  Embryo:  N/A  Maternal uterus/adnexae: The uterus is unremarkable in appearance.  The ovaries are within normal limits. The right ovary measures 5.0 x 2.1 x 2.6 cm, while the left ovary measures 3.3 x 1.9 x 1.8 cm. No suspicious adnexal masses are seen; there is no evidence for  torsion.  A small amount of free fluid is seen within  the pelvic cul-de-sac.  IMPRESSION: No intrauterine gestational sac seen at this time. No definite evidence for ectopic pregnancy. Given the relatively low quantitative beta HCG, a intrauterine pregnancy may not yet be visible. Would follow-up quantitative beta HCG levels. If they continue to rise, follow-up pelvic ultrasound could be performed in 1-2 weeks for further evaluation.   Electronically Signed   By: Roanna Raider M.D.   On: 02/11/2014 21:46   US Ob Transvaginal  02/11/2014   CLINICAL DATA:  Pelvic cramping for 2 weeks.  Vaginal discharge.  EXAM: OBSTETRIC <14 WK [place] AND TRANSVAGINAL OB US  TECHNIQUE: Both transabdominal and transvaginal ultrasound examinations were performed for complete evaluation of the gestation as well as the maternal uterus, adnexal regions, and pelvic cul-de-sac. Transvaginal technique was performed to assess early pregnancy.  COMPARISON:  None.  FINDINGS: Intrauterine gestational sac:  None seen.  Yolk sac:  N/A  Embryo:  N/A  Maternal uterus/adnexae: The uterus is unremarkable in appearance.  The ovaries are within normal limits. The right ovary measures 5.0 x 2.1 x 2.6 cm, while the left ovary measures 3.3 x 1.9 x 1.8 cm. No suspicious adnexal masses are seen; there is no evidence for torsion.  A small amount of free fluid is seen within the pelvic cul-de-sac.  IMPRESSION: No intrauterine gestational sac seen at this time. No definite evidence for ectopic pregnancy. Given the relatively low quantitative beta HCG, a intrauterine pregnancy may not yet be visible. Would follow-up quantitative beta HCG levels. If they continue to rise, follow-up pelvic ultrasound could be performed in 1-2 weeks for further evaluation.   Electronically Signed   By: Roanna Raider M.D.   On: 02/11/2014 21:46    MAU Course  Procedures None  MDM UA, wet prep, GC/Chlamydia, CBC, ABO/Rh, quant hCG and [place] today  Assessment and Plan   A: Abdominal pain in pregnancy Vaginal bleeding in pregnancy prior to [redacted] weeks gestation Bacterial vaginosis Pregnancy of unknown location  P: Discharge home Rx for Metrogel sent to patient's pharmacy Bleeding/ectopic precautions discussed Patient to return to MAU on 02/13/14 for follow-up labs or sooner if symptoms worsen  Freddi Starr, PA-C  02/11/2014, 10:06 PM

## 2014-02-11 NOTE — MAU Note (Signed)
Vaginal discharge x 2 weeks with foul odor. Thinks she had some implantations bleeding last Saturday, no bleeding since then. Abdominal cramping intermittently, thinks it's related to gas.

## 2014-02-12 LAB — GC/CHLAMYDIA PROBE AMP
CT PROBE, AMP APTIMA: NEGATIVE
GC Probe RNA: NEGATIVE

## 2014-02-12 LAB — HIV ANTIBODY (ROUTINE TESTING W REFLEX): HIV 1&2 Ab, 4th Generation: NONREACTIVE

## 2014-02-12 LAB — ABO/RH: ABO/RH(D): O POS

## 2014-02-12 NOTE — MAU Provider Note (Signed)
Attestation of Attending Supervision of Advanced Practitioner (CNM/NP): Evaluation and management procedures were performed by the Advanced Practitioner under my supervision and collaboration. I have reviewed the Advanced Practitioner's note and chart, and I agree with the management and plan.  Dyrell Tuccillo H. 6:23 AM

## 2014-05-03 ENCOUNTER — Encounter (HOSPITAL_COMMUNITY): Payer: Self-pay

## 2014-05-22 ENCOUNTER — Encounter (HOSPITAL_COMMUNITY): Payer: Self-pay | Admitting: *Deleted

## 2014-05-22 ENCOUNTER — Inpatient Hospital Stay (HOSPITAL_COMMUNITY)
Admission: AD | Admit: 2014-05-22 | Discharge: 2014-05-22 | Disposition: A | Discharge status: Home / Self Care | Payer: BC Managed Care – PPO | Source: Ambulatory Visit | Attending: Obstetrics and Gynecology | Admitting: Obstetrics and Gynecology

## 2014-05-22 DIAGNOSIS — O9989 Other specified diseases and conditions complicating pregnancy, childbirth and the puerperium: Secondary | ICD-10-CM

## 2014-05-22 DIAGNOSIS — O26892 Other specified pregnancy related conditions, second trimester: Secondary | ICD-10-CM | POA: Diagnosis present

## 2014-05-22 DIAGNOSIS — N949 Unspecified condition associated with female genital organs and menstrual cycle: Secondary | ICD-10-CM

## 2014-05-22 DIAGNOSIS — R102 Pelvic and perineal pain: Secondary | ICD-10-CM | POA: Diagnosis not present

## 2014-05-22 DIAGNOSIS — Z3A18 18 weeks gestation of pregnancy: Secondary | ICD-10-CM | POA: Diagnosis not present

## 2014-05-22 DIAGNOSIS — O26899 Other specified pregnancy related conditions, unspecified trimester: Secondary | ICD-10-CM

## 2014-05-22 HISTORY — DX: Other specified bacterial agents as the cause of diseases classified elsewhere: B96.89

## 2014-05-22 HISTORY — DX: Anemia, unspecified: D64.9

## 2014-05-22 HISTORY — DX: Acute vaginitis: N76.0

## 2014-05-22 LAB — URINALYSIS, ROUTINE W REFLEX MICROSCOPIC
BILIRUBIN URINE: NEGATIVE
Glucose, UA: NEGATIVE mg/dL
HGB URINE DIPSTICK: NEGATIVE
Ketones, ur: NEGATIVE mg/dL
Leukocytes, UA: NEGATIVE
Nitrite: NEGATIVE
Protein, ur: NEGATIVE mg/dL
UROBILINOGEN UA: 0.2 mg/dL (ref 0.0–1.0)
pH: 6 (ref 5.0–8.0)

## 2014-05-22 LAB — WET PREP, GENITAL
CLUE CELLS WET PREP: NONE SEEN
TRICH WET PREP: NONE SEEN
YEAST WET PREP: NONE SEEN

## 2014-05-22 NOTE — MAU Provider Note (Signed)
History     CSN: [id number]  Arrival date and time: 05/22/14 1523   First Provider Initiated Contact with Patient 05/22/14 1609      Chief Complaint  Patient presents with  . Abdominal Pain   HPI 38 y.o. G3P2002 at 4378w5d with low abd pain, sharp in suprapubic and inguinal area, occurs randomly and with certain activities. Had bleeding 2 weeks ago, none since. Also had some discharge w/ onion odor, used some metrogel, but discharge became thick and white, so stopped using gel. [facility], first 2 pregnancies uncomplicated.   Past Medical History  Diagnosis Date  . Anemia   . Bacterial vaginosis     History reviewed. No pertinent past surgical history.  Family History  Problem Relation Age of Onset  . Cancer Mother     History  Substance Use Topics  . Smoking status: Never Smoker   . Smokeless tobacco: Never Used  . Alcohol Use: Yes     Comment: OCC    Allergies:  Allergies  Allergen Reactions  . Penicillins Hives and Other (See Comments)    Pt has taken cephalosporins without any problem.   . Latex Other (See Comments)    Pt states she had Irritation in vagina from gloves after exam.    Prescriptions prior to admission  Medication Sig Dispense Refill Last Dose  . Prenatal Vit-Fe Fumarate-FA (PRENATAL MULTIVITAMIN) TABS tablet Take 1 tablet by mouth daily.   Past Week at Unknown time  . Docosahexaenoic Acid (DHA PO) Take 1 each by mouth daily.   02/10/2014 at Unknown time  . EPINEPHrine (EPI-PEN) 0.3 mg/0.3 mL DEVI Inject 0.3 mLs (0.3 mg total) into the muscle once. In event of severe allergic reaction. 1 Device 0 rescue  . metroNIDAZOLE (METROGEL VAGINAL) 0.75 % vaginal gel Place 1 Applicatorful vaginally 2 (two) times daily. (Patient not taking: Reported on 05/22/2014) 70 g 0     Review of Systems  Constitutional: Negative.   Respiratory: Negative.   Cardiovascular: Negative.   Gastrointestinal: Positive for abdominal pain. Negative for nausea,  vomiting, diarrhea and constipation.  Genitourinary: Negative for dysuria, urgency, frequency, hematuria and flank pain.       Negative for vaginal bleeding, + discharge  Musculoskeletal: Negative.   Neurological: Negative.   Psychiatric/Behavioral: Negative.    Physical Exam   Blood pressure 115/64, pulse 94, temperature 98.6 F (37 C), temperature source Oral, resp. rate 18, height 5' 4.25" (1.632 m), weight 128 lb 4 oz (58.174 kg), last menstrual period 01/11/2014.  Physical Exam  Nursing note and vitals reviewed. Constitutional: She is oriented to person, place, and time. She appears well-developed and well-nourished. No distress.  Cardiovascular: Normal rate.   Respiratory: Effort normal.  GI: Soft. There is no tenderness.  Genitourinary: There is no tenderness, lesion or injury on the right labia. There is no tenderness, lesion or injury on the left labia. Uterus is enlarged (c/w dates). Uterus is not tender. Cervix exhibits no motion tenderness. Right adnexum displays no mass. Left adnexum displays no mass. No bleeding in the vagina. Vaginal discharge (mucous and clumpy white) found.  Tenderness in round ligament area bilaterally  Musculoskeletal: Normal range of motion.  Neurological: She is alert and oriented to person, place, and time.  Skin: Skin is warm and dry.  Psychiatric: She has a normal mood and affect.    MAU Course  Procedures Results for orders placed or performed during the hospital encounter of 05/22/14 (from the past 24 hour(s))  Urinalysis, Routine w reflex microscopic     Status: Abnormal   Collection Time: 05/22/14  3:31 PM  Result Value Ref Range   Color, Urine YELLOW YELLOW   APPearance CLEAR CLEAR   Specific Gravity, Urine >1.030 (H) 1.005 - 1.030   pH 6.0 5.0 - 8.0   Glucose, UA NEGATIVE NEGATIVE mg/dL   Hgb urine dipstick NEGATIVE NEGATIVE   Bilirubin Urine NEGATIVE NEGATIVE   Ketones, ur NEGATIVE NEGATIVE mg/dL   Protein, ur NEGATIVE NEGATIVE  mg/dL   Urobilinogen, UA 0.2 0.0 - 1.0 mg/dL   Nitrite NEGATIVE NEGATIVE   Leukocytes, UA NEGATIVE NEGATIVE  Wet prep, genital     Status: Abnormal   Collection Time: 05/22/14  4:05 PM  Result Value Ref Range   Yeast Wet Prep HPF POC NONE SEEN NONE SEEN   Trich, Wet Prep NONE SEEN NONE SEEN   Clue Cells Wet Prep HPF POC NONE SEEN NONE SEEN   WBC, Wet Prep HPF POC FEW (A) NONE SEEN     Assessment and Plan   1. Pain of round ligament complicating pregnancy, antepartum       Medication List    STOP taking these medications        DHA PO     metroNIDAZOLE 0.75 % vaginal gel  Commonly known as:  METROGEL VAGINAL      TAKE these medications        EPINEPHrine 0.3 mg/0.3 mL Devi  Commonly known as:  EPI-PEN  Inject 0.3 mLs (0.3 mg total) into the muscle once. In event of severe allergic reaction.     prenatal multivitamin Tabs tablet  Take 1 tablet by mouth daily.        Follow-up Information    [facility]   Specialty:  Obstetrics and Gynecology   Contact information:   [address] [place] [id number] [phone]        [facility],Trejon Duford 05/22/2014, 4:38 PM

## 2014-05-22 NOTE — MAU Note (Signed)
States she used some Metrogel 3 days ago. She had it left over from previous visit. Thought a bacterial infection might be causing her pain. Was trying to avoid hospital visit. Did not continue to use because it appeared to be an abnormal color.

## 2014-05-22 NOTE — MAU Note (Signed)
Pt states had bleeding 2 weeks ago that lasted the entire day then stopped. One week ago pt began having lower abdominal pain and feels like abdomen is tightening. No abnormal vaginal discharge

## 2014-05-25 LAB — GC/CHLAMYDIA PROBE AMP
CT Probe RNA: NEGATIVE
GC PROBE AMP APTIMA: NEGATIVE

## 2014-05-26 ENCOUNTER — Encounter: Payer: Self-pay | Admitting: Physician Assistant

## 2014-05-26 ENCOUNTER — Ambulatory Visit (INDEPENDENT_AMBULATORY_CARE_PROVIDER_SITE_OTHER): Payer: BC Managed Care – PPO | Admitting: Physician Assistant

## 2014-05-26 VITALS — BP 127/70 | HR 93 | Wt 130.4 lb

## 2014-05-26 DIAGNOSIS — Z3480 Encounter for supervision of other normal pregnancy, unspecified trimester: Secondary | ICD-10-CM | POA: Insufficient documentation

## 2014-05-26 DIAGNOSIS — O09522 Supervision of elderly multigravida, second trimester: Secondary | ICD-10-CM

## 2014-05-26 DIAGNOSIS — Z3482 Encounter for supervision of other normal pregnancy, second trimester: Secondary | ICD-10-CM

## 2014-05-26 DIAGNOSIS — O09529 Supervision of elderly multigravida, unspecified trimester: Secondary | ICD-10-CM | POA: Insufficient documentation

## 2014-05-26 NOTE — Progress Notes (Signed)
Had a pap done in June with her regular physicians office.  Cultures were done at the hospital on the 21st.

## 2014-05-26 NOTE — Progress Notes (Signed)
   Subjective:    Kathy Sharp is a [id number] 8952w0d being seen today for her first obstetrical visit.  Her obstetrical history is significant for advanced maternal age. Patient does intend to breast feed. Pregnancy history fully reviewed.  Patient reports nausea with PNV.    Filed Vitals:   05/26/14 1431  BP: 127/70  Pulse: 93  Weight: 130 lb 6.4 oz (59.149 kg)    HISTORY: OB History  Gravida Para Term Preterm AB SAB TAB Ectopic Multiple Living  3 2 2       2     # Outcome Date GA Lbr Len/2nd Weight Sex Delivery Anes PTL Lv  3 Current           2 Term     F Vag-Spont   Y  1 Term     M Vag-Spont   Y     Past Medical History  Diagnosis Date  . Anemia   . Bacterial vaginosis    History reviewed. No pertinent past surgical history. Family History  Problem Relation Age of Onset  . Cancer Mother      Exam    Uterus:  Fundal Height: 20 cm                                 System:     Skin: normal coloration and turgor, no rashes    Neurologic: oriented, normal mood, grossly non-focal   Extremities: normal strength, tone, and muscle mass   HEENT extra ocular movement intact   Mouth/Teeth mucous membranes moist, pharynx normal without lesions and dental hygiene good   Neck supple   Cardiovascular: regular rate and rhythm, no murmurs or gallops   Respiratory:  appears well, vitals normal, no respiratory distress, acyanotic, normal RR, ear and throat exam is normal, neck free of mass or lymphadenopathy, chest clear, no wheezing, crepitations, rhonchi, normal symmetric air entry   Abdomen: soft, non-tender; bowel sounds normal; no masses,  no organomegaly     Assessment:    Pregnancy: [id number] Patient Active Problem List   Diagnosis Date Noted  . Supervision of other normal pregnancy 05/26/2014  . AMA (advanced maternal age) multigravida 35+ 05/26/2014        Plan:     Initial labs drawn. Prenatal vitamins.  Samples provided for pt to trial and see if less  nausea. Problem list reviewed and updated. Genetic Screening discussed:too late  Ultrasound discussed; fetal survey: ordered.  Follow up in 4 weeks. 50% of 30 min visit spent on counseling and coordination of care.    Bertram Denvereague Clark, Karen E 05/26/2014

## 2014-05-26 NOTE — Patient Instructions (Signed)
Second Trimester of Pregnancy The second trimester is from week 13 through week 28, months 4 through 6. The second trimester is often a time when you feel your best. Your body has also adjusted to being pregnant, and you begin to feel better physically. Usually, morning sickness has lessened or quit completely, you may have more energy, and you may have an increase in appetite. The second trimester is also a time when the fetus is growing rapidly. At the end of the sixth month, the fetus is about 9 inches long and weighs about 1 pounds. You will likely begin to feel the baby move (quickening) between 18 and 20 weeks of the pregnancy. BODY CHANGES Your body goes through many changes during pregnancy. The changes vary from woman to woman.   Your weight will continue to increase. You will notice your lower abdomen bulging out.  You may begin to get stretch marks on your hips, abdomen, and breasts.  You may develop headaches that can be relieved by medicines approved by your health care provider.  You may urinate more often because the fetus is pressing on your bladder.  You may develop or continue to have heartburn as a result of your pregnancy.  You may develop constipation because certain hormones are causing the muscles that push waste through your intestines to slow down.  You may develop hemorrhoids or swollen, bulging veins (varicose veins).  You may have back pain because of the weight gain and pregnancy hormones relaxing your joints between the bones in your pelvis and as a result of a shift in weight and the muscles that support your balance.  Your breasts will continue to grow and be tender.  Your gums may bleed and may be sensitive to brushing and flossing.  Dark spots or blotches (chloasma, mask of pregnancy) may develop on your face. This will likely fade after the baby is born.  A dark line from your belly button to the pubic area (linea nigra) may appear. This will likely fade  after the baby is born.  You may have changes in your hair. These can include thickening of your hair, rapid growth, and changes in texture. Some women also have hair loss during or after pregnancy, or hair that feels dry or thin. Your hair will most likely return to normal after your baby is born. WHAT TO EXPECT AT YOUR PRENATAL VISITS During a routine prenatal visit:  You will be weighed to make sure you and the fetus are growing normally.  Your blood pressure will be taken.  Your abdomen will be measured to track your baby's growth.  The fetal heartbeat will be listened to.  Any test results from the previous visit will be discussed. Your health care provider may ask you:  How you are feeling.  If you are feeling the baby move.  If you have had any abnormal symptoms, such as leaking fluid, bleeding, severe headaches, or abdominal cramping.  If you have any questions. Other tests that may be performed during your second trimester include:  Blood tests that check for:  Low iron levels (anemia).  Gestational diabetes (between 24 and 28 weeks).  Rh antibodies.  Urine tests to check for infections, diabetes, or protein in the urine.  An ultrasound to confirm the proper growth and development of the baby.  An amniocentesis to check for possible genetic problems.  Fetal screens for spina bifida and Down syndrome. HOME CARE INSTRUCTIONS   Avoid all smoking, herbs, alcohol, and unprescribed   drugs. These chemicals affect the formation and growth of the baby.  Follow your health care provider's instructions regarding medicine use. There are medicines that are either safe or unsafe to take during pregnancy.  Exercise only as directed by your health care provider. Experiencing uterine cramps is a good sign to stop exercising.  Continue to eat regular, healthy meals.  Wear a good support bra for breast tenderness.  Do not use hot tubs, steam rooms, or saunas.  Wear your  seat belt at all times when driving.  Avoid raw meat, uncooked cheese, cat litter boxes, and soil used by cats. These carry germs that can cause birth defects in the baby.  Take your prenatal vitamins.  Try taking a stool softener (if your health care provider approves) if you develop constipation. Eat more high-fiber foods, such as fresh vegetables or fruit and whole grains. Drink plenty of fluids to keep your urine clear or pale yellow.  Take warm sitz baths to soothe any pain or discomfort caused by hemorrhoids. Use hemorrhoid cream if your health care provider approves.  If you develop varicose veins, wear support hose. Elevate your feet for 15 minutes, 3-4 times a day. Limit salt in your diet.  Avoid heavy lifting, wear low heel shoes, and practice good posture.  Rest with your legs elevated if you have leg cramps or low back pain.  Visit your dentist if you have not gone yet during your pregnancy. Use a soft toothbrush to brush your teeth and be gentle when you floss.  A sexual relationship may be continued unless your health care provider directs you otherwise.  Continue to go to all your prenatal visits as directed by your health care provider. SEEK MEDICAL CARE IF:   You have dizziness.  You have mild pelvic cramps, pelvic pressure, or nagging pain in the abdominal area.  You have persistent nausea, vomiting, or diarrhea.  You have a bad smelling vaginal discharge.  You have pain with urination. SEEK IMMEDIATE MEDICAL CARE IF:   You have a fever.  You are leaking fluid from your vagina.  You have spotting or bleeding from your vagina.  You have severe abdominal cramping or pain.  You have rapid weight gain or loss.  You have shortness of breath with chest pain.  You notice sudden or extreme swelling of your face, hands, ankles, feet, or legs.  You have not felt your baby move in over an hour.  You have severe headaches that do not go away with  medicine.  You have vision changes. Document Released: 06/12/2001 Document Revised: 06/23/2013 Document Reviewed: 08/19/2012 Exit[employer] Patient Information 2015 [employer]. This information is not intended to replace advice given to you by your health care provider. Make sure you discuss any questions you have with your health care provider.  

## 2014-05-28 LAB — PRENATAL PROFILE (SOLSTAS)
Antibody Screen: NEGATIVE
BASOS PCT: 0 % (ref 0–1)
Basophils Absolute: 0 10*3/uL (ref 0.0–0.1)
Eosinophils Absolute: 0.1 10*3/uL (ref 0.0–0.7)
Eosinophils Relative: 1 % (ref 0–5)
HCT: 33.9 % — ABNORMAL LOW (ref 36.0–46.0)
HEP B S AG: NEGATIVE
HIV 1&2 Ab, 4th Generation: NONREACTIVE
Hemoglobin: 11.9 g/dL — ABNORMAL LOW (ref 12.0–15.0)
Lymphocytes Relative: 22 % (ref 12–46)
Lymphs Abs: 1.5 10*3/uL (ref 0.7–4.0)
MCH: 30.9 pg (ref 26.0–34.0)
MCHC: 35.1 g/dL (ref 30.0–36.0)
MCV: 88.1 fL (ref 78.0–100.0)
MPV: 9.9 fL (ref 9.4–12.4)
Monocytes Absolute: 0.6 10*3/uL (ref 0.1–1.0)
Monocytes Relative: 9 % (ref 3–12)
NEUTROS ABS: 4.6 10*3/uL (ref 1.7–7.7)
NEUTROS PCT: 68 % (ref 43–77)
PLATELETS: 206 10*3/uL (ref 150–400)
RBC: 3.85 MIL/uL — ABNORMAL LOW (ref 3.87–5.11)
RDW: 14.5 % (ref 11.5–15.5)
Rh Type: POSITIVE
Rubella: 2.53 Index — ABNORMAL HIGH (ref <0.90)
WBC: 6.7 10*3/uL (ref 4.0–10.5)

## 2014-05-29 LAB — CULTURE, OB URINE: Colony Count: 1000

## 2014-06-03 ENCOUNTER — Ambulatory Visit (HOSPITAL_COMMUNITY): Payer: BC Managed Care – PPO

## 2014-06-10 ENCOUNTER — Other Ambulatory Visit: Payer: Self-pay | Admitting: Physician Assistant

## 2014-06-10 ENCOUNTER — Ambulatory Visit (HOSPITAL_COMMUNITY)
Admission: RE | Admit: 2014-06-10 | Discharge: 2014-06-10 | Disposition: A | Discharge status: Home / Self Care | Payer: BC Managed Care – PPO | Source: Ambulatory Visit | Attending: Physician Assistant | Admitting: Physician Assistant

## 2014-06-10 DIAGNOSIS — Z3482 Encounter for supervision of other normal pregnancy, second trimester: Secondary | ICD-10-CM

## 2014-06-10 DIAGNOSIS — Z3689 Encounter for other specified antenatal screening: Secondary | ICD-10-CM | POA: Insufficient documentation

## 2014-06-10 DIAGNOSIS — Z3A21 21 weeks gestation of pregnancy: Secondary | ICD-10-CM | POA: Insufficient documentation

## 2014-06-10 DIAGNOSIS — O09529 Supervision of elderly multigravida, unspecified trimester: Secondary | ICD-10-CM | POA: Insufficient documentation

## 2014-06-22 ENCOUNTER — Encounter: Payer: BC Managed Care – PPO | Admitting: Family Medicine

## 2014-07-02 NOTE — L&D Delivery Note (Signed)
Patient is 38 y.o. [id number] 792w4d admitted for onset of labor   Delivery Note At 11:41 PM a viable female (Aiden) was delivered via  (Presentation: OA;  ).  APGAR:9, 9; weight pending .   Placenta status: intact.  Cord: 3 vessel. Infant dried and placed on mother's abdomen.  Cord clamped and cut by his aunt.  Hospital cord blood sample collected.   Anesthesia:  none Episiotomy:  none Lacerations:  Excoriation on b/l labia  Suture Repair: did not require repair Est. Blood Loss (mL): 150cc  Mom to postpartum.  Baby to Couplet care / Skin to Skin.  Delynn FlavinGottschalk, Ashly M, DO 10/17/2014, 12:22 AM

## 2014-07-06 ENCOUNTER — Encounter: Payer: BC Managed Care – PPO | Admitting: Family Medicine

## 2014-07-09 ENCOUNTER — Encounter: Payer: Self-pay | Admitting: Obstetrics & Gynecology

## 2014-07-09 ENCOUNTER — Ambulatory Visit (INDEPENDENT_AMBULATORY_CARE_PROVIDER_SITE_OTHER): Payer: Medicaid Other | Admitting: Obstetrics & Gynecology

## 2014-07-09 VITALS — BP 106/61 | HR 93 | Wt 135.2 lb

## 2014-07-09 DIAGNOSIS — Z3492 Encounter for supervision of normal pregnancy, unspecified, second trimester: Secondary | ICD-10-CM

## 2014-07-09 DIAGNOSIS — Z3482 Encounter for supervision of other normal pregnancy, second trimester: Secondary | ICD-10-CM

## 2014-07-09 DIAGNOSIS — O283 Abnormal ultrasonic finding on antenatal screening of mother: Secondary | ICD-10-CM | POA: Insufficient documentation

## 2014-07-09 DIAGNOSIS — R938 Abnormal findings on diagnostic imaging of other specified body structures: Secondary | ICD-10-CM

## 2014-07-09 NOTE — Progress Notes (Signed)
+  FM, No ctx, No LOF, No VB D/w pt echogenic focus of LV- sono to be repeated in third trimester Reviewed +GBS in urine- needs atbx in labor

## 2014-07-09 NOTE — Patient Instructions (Signed)
Group B Streptococcus Infection During Pregnancy Group B streptococcus (GBS) is a type of bacteria often found in healthy women. GBS is not the same as the bacteria that causes strep throat. You may have GBS in your vagina, rectum, or bladder. GBS does not spread through sexual contact, but it can be passed to a baby during childbirth. This can be dangerous for your baby. It is not dangerous to you and usually does not cause any symptoms. Your health care provider may test you for GBS when your pregnancy is between 35 and 37 weeks. GBS is dangerous only during birth, so there is no need to test for it earlier. It is possible to have GBS during pregnancy and never pass it to your baby. If your test results are positive for GBS, your health care provider may recommend giving you antibiotic medicine during delivery to make sure your baby stays healthy. RISK FACTORS You are more likely to pass GBS to your baby if:   Your water breaks (ruptured membrane) or you go into labor before 37 weeks.  Your water breaks 18 hours before you deliver.  You passed GBS during a previous pregnancy.  You have a urinary tract infection caused by GBS any time during pregnancy.  You have a fever during labor. SYMPTOMS Most women who have GBS do not have any symptoms. If you have a urinary tract infection caused by GBS, you might have frequent or painful urination and fever. Babies who get GBS usually show symptoms within 7 days of birth. Symptoms may include:   Breathing problems.  Heart and blood pressure problems.  Digestive and kidney problems. DIAGNOSIS Routine screening for GBS is recommended for all pregnant women. A health care provider takes a sample of the fluid in your vagina and rectum with a swab. It is then sent to a lab to be checked for GBS. A sample of your urine may also be checked for the bacteria.  TREATMENT If you test positive for GBS, you may need treatment with an antibiotic medicine during  labor. As soon as you go into labor, or as soon as your membranes rupture, you will get the antibiotic medicine through an IV access. You will continue to get the medicine until after you give birth. You do not need antibiotic medicine if you are having a cesarean delivery.If your baby shows signs or symptoms of GBS after birth, your baby can also be treated with an antibiotic medicine. HOME CARE INSTRUCTIONS   Take all antibiotic medicine as prescribed by your health care provider. Only take medicine as directed.   Continue with prenatal visits and care.   Keep all follow-up appointments.  SEEK MEDICAL CARE IF:   You have pain when you urinate.   You have to urinate frequently.   You have a fever.  SEEK IMMEDIATE MEDICAL CARE IF:   Your membranes rupture.  You go into labor. Document Released: 09/25/2007 Document Revised: 06/23/2013 Document Reviewed: 04/10/2013 [employer] Patient Information 2015 [employer]. This information is not intended to replace advice given to you by your health care provider. Make sure you discuss any questions you have with your health care provider.  

## 2014-08-06 ENCOUNTER — Ambulatory Visit (INDEPENDENT_AMBULATORY_CARE_PROVIDER_SITE_OTHER): Payer: Medicaid Other | Admitting: Family Medicine

## 2014-08-06 ENCOUNTER — Encounter: Payer: Self-pay | Admitting: Family Medicine

## 2014-08-06 ENCOUNTER — Other Ambulatory Visit: Payer: Self-pay | Admitting: Family Medicine

## 2014-08-06 VITALS — BP 106/71 | HR 90 | Wt 139.0 lb

## 2014-08-06 DIAGNOSIS — Z3483 Encounter for supervision of other normal pregnancy, third trimester: Secondary | ICD-10-CM

## 2014-08-06 DIAGNOSIS — O283 Abnormal ultrasonic finding on antenatal screening of mother: Secondary | ICD-10-CM

## 2014-08-06 DIAGNOSIS — Z3A31 31 weeks gestation of pregnancy: Secondary | ICD-10-CM

## 2014-08-06 DIAGNOSIS — N898 Other specified noninflammatory disorders of vagina: Secondary | ICD-10-CM

## 2014-08-06 DIAGNOSIS — O26893 Other specified pregnancy related conditions, third trimester: Secondary | ICD-10-CM

## 2014-08-06 DIAGNOSIS — O09522 Supervision of elderly multigravida, second trimester: Secondary | ICD-10-CM

## 2014-08-06 DIAGNOSIS — O09523 Supervision of elderly multigravida, third trimester: Secondary | ICD-10-CM

## 2014-08-06 NOTE — Progress Notes (Signed)
Will offer genetics to patient. C/o headaches--worse because she is living in an apartment with children above her and the stress of the noise they make. 28 wk labs on Monday C/o vaginal discharge with odor and Braxton Hicks contractions

## 2014-08-06 NOTE — Progress Notes (Signed)
Headaches have gotten worse since being pregnant.  Have form from her landlord needs to be signed so she can move to another unit due to noises that contribute to her headaches. Having a thick vaginal discharge with some odor.  Plans to return Monday morning for her 1hr GTT.

## 2014-08-06 NOTE — Patient Instructions (Signed)
Third Trimester of Pregnancy The third trimester is from week 29 through week 42, months 7 through 9. The third trimester is a time when the fetus is growing rapidly. At the end of the ninth month, the fetus is about 20 inches in length and weighs 6-10 pounds.  BODY CHANGES Your body goes through many changes during pregnancy. The changes vary from woman to woman.   Your weight will continue to increase. You can expect to gain 25-35 pounds (11-16 kg) by the end of the pregnancy.  You may begin to get stretch marks on your hips, abdomen, and breasts.  You may urinate more often because the fetus is moving lower into your pelvis and pressing on your bladder.  You may develop or continue to have heartburn as a result of your pregnancy.  You may develop constipation because certain hormones are causing the muscles that push waste through your intestines to slow down.  You may develop hemorrhoids or swollen, bulging veins (varicose veins).  You may have pelvic pain because of the weight gain and pregnancy hormones relaxing your joints between the bones in your pelvis. Backaches may result from overexertion of the muscles supporting your posture.  You may have changes in your hair. These can include thickening of your hair, rapid growth, and changes in texture. Some women also have hair loss during or after pregnancy, or hair that feels dry or thin. Your hair will most likely return to normal after your baby is born.  Your breasts will continue to grow and be tender. A yellow discharge may leak from your breasts called colostrum.  Your belly button may stick out.  You may feel short of breath because of your expanding uterus.  You may notice the fetus "dropping," or moving lower in your abdomen.  You may have a bloody mucus discharge. This usually occurs a few days to a week before labor begins.  Your cervix becomes thin and soft (effaced) near your due date. WHAT TO EXPECT AT YOUR  PRENATAL EXAMS  You will have prenatal exams every 2 weeks until week 36. Then, you will have weekly prenatal exams. During a routine prenatal visit:  You will be weighed to make sure you and the fetus are growing normally.  Your blood pressure is taken.  Your abdomen will be measured to track your baby's growth.  The fetal heartbeat will be listened to.  Any test results from the previous visit will be discussed.  You may have a cervical check near your due date to see if you have effaced. At around 36 weeks, your caregiver will check your cervix. At the same time, your caregiver will also perform a test on the secretions of the vaginal tissue. This test is to determine if a type of bacteria, Group B streptococcus, is present. Your caregiver will explain this further. Your caregiver may ask you:  What your birth plan is.  How you are feeling.  If you are feeling the baby move.  If you have had any abnormal symptoms, such as leaking fluid, bleeding, severe headaches, or abdominal cramping.  If you have any questions. Other tests or screenings that may be performed during your third trimester include:  Blood tests that check for low iron levels (anemia).  Fetal testing to check the health, activity level, and growth of the fetus. Testing is done if you have certain medical conditions or if there are problems during the pregnancy. FALSE LABOR You may feel small, irregular contractions that   eventually go away. These are called Braxton Hicks contractions, or false labor. Contractions may last for hours, days, or even weeks before true labor sets in. If contractions come at regular intervals, intensify, or become painful, it is best to be seen by your caregiver.  SIGNS OF LABOR   Menstrual-like cramps.  Contractions that are 5 minutes apart or less.  Contractions that start on the top of the uterus and spread down to the lower abdomen and back.  A sense of increased pelvic  pressure or back pain.  A watery or bloody mucus discharge that comes from the vagina. If you have any of these signs before the 37th week of pregnancy, call your caregiver right away. You need to go to the hospital to get checked immediately. HOME CARE INSTRUCTIONS   Avoid all smoking, herbs, alcohol, and unprescribed drugs. These chemicals affect the formation and growth of the baby.  Follow your caregiver's instructions regarding medicine use. There are medicines that are either safe or unsafe to take during pregnancy.  Exercise only as directed by your caregiver. Experiencing uterine cramps is a good sign to stop exercising.  Continue to eat regular, healthy meals.  Wear a good support bra for breast tenderness.  Do not use hot tubs, steam rooms, or saunas.  Wear your seat belt at all times when driving.  Avoid raw meat, uncooked cheese, cat litter boxes, and soil used by cats. These carry germs that can cause birth defects in the baby.  Take your prenatal vitamins.  Try taking a stool softener (if your caregiver approves) if you develop constipation. Eat more high-fiber foods, such as fresh vegetables or fruit and whole grains. Drink plenty of fluids to keep your urine clear or pale yellow.  Take warm sitz baths to soothe any pain or discomfort caused by hemorrhoids. Use hemorrhoid cream if your caregiver approves.  If you develop varicose veins, wear support hose. Elevate your feet for 15 minutes, 3-4 times a day. Limit salt in your diet.  Avoid heavy lifting, wear low heal shoes, and practice good posture.  Rest a lot with your legs elevated if you have leg cramps or low back pain.  Visit your dentist if you have not gone during your pregnancy. Use a soft toothbrush to brush your teeth and be gentle when you floss.  A sexual relationship may be continued unless your caregiver directs you otherwise.  Do not travel far distances unless it is absolutely necessary and only  with the approval of your caregiver.  Take prenatal classes to understand, practice, and ask questions about the labor and delivery.  Make a trial run to the hospital.  Pack your hospital bag.  Prepare the baby's nursery.  Continue to go to all your prenatal visits as directed by your caregiver. SEEK MEDICAL CARE IF:  You are unsure if you are in labor or if your water has broken.  You have dizziness.  You have mild pelvic cramps, pelvic pressure, or nagging pain in your abdominal area.  You have persistent nausea, vomiting, or diarrhea.  You have a bad smelling vaginal discharge.  You have pain with urination. SEEK IMMEDIATE MEDICAL CARE IF:   You have a fever.  You are leaking fluid from your vagina.  You have spotting or bleeding from your vagina.  You have severe abdominal cramping or pain.  You have rapid weight loss or gain.  You have shortness of breath with chest pain.  You notice sudden or extreme swelling   of your face, hands, ankles, feet, or legs.  You have not felt your baby move in over an hour.  You have severe headaches that do not go away with medicine.  You have vision changes. Document Released: 06/12/2001 Document Revised: 06/23/2013 Document Reviewed: 08/19/2012 [employer] Patient Information 2015 [employer]. This information is not intended to replace advice given to you by your health care provider. Make sure you discuss any questions you have with your health care provider.  Breastfeeding Deciding to breastfeed is one of the best choices you can make for you and your baby. A change in hormones during pregnancy causes your breast tissue to grow and increases the number and size of your milk ducts. These hormones also allow proteins, sugars, and fats from your blood supply to make breast milk in your milk-producing glands. Hormones prevent breast milk from being released before your baby is born as well as prompt milk flow after birth. Once  breastfeeding has begun, thoughts of your baby, as well as his or her sucking or crying, can stimulate the release of milk from your milk-producing glands.  BENEFITS OF BREASTFEEDING For Your Baby  Your first milk (colostrum) helps your baby's digestive system function better.   There are antibodies in your milk that help your baby fight off infections.   Your baby has a lower incidence of asthma, allergies, and sudden infant death syndrome.   The nutrients in breast milk are better for your baby than infant formulas and are designed uniquely for your baby's needs.   Breast milk improves your baby's brain development.   Your baby is less likely to develop other conditions, such as childhood obesity, asthma, or type 2 diabetes mellitus.  For You   Breastfeeding helps to create a very special bond between you and your baby.   Breastfeeding is convenient. Breast milk is always available at the correct temperature and costs nothing.   Breastfeeding helps to burn calories and helps you lose the weight gained during pregnancy.   Breastfeeding makes your uterus contract to its prepregnancy size faster and slows bleeding (lochia) after you give birth.   Breastfeeding helps to lower your risk of developing type 2 diabetes mellitus, osteoporosis, and breast or ovarian cancer later in life. SIGNS THAT YOUR BABY IS HUNGRY Early Signs of Hunger  Increased alertness or activity.  Stretching.  Movement of the head from side to side.  Movement of the head and opening of the mouth when the corner of the mouth or cheek is stroked (rooting).  Increased sucking sounds, smacking lips, cooing, sighing, or squeaking.  Hand-to-mouth movements.  Increased sucking of fingers or hands. Late Signs of Hunger  Fussing.  Intermittent crying. Extreme Signs of Hunger Signs of extreme hunger will require calming and consoling before your baby will be able to breastfeed successfully. Do not  wait for the following signs of extreme hunger to occur before you initiate breastfeeding:   Restlessness.  A loud, strong cry.   Screaming. BREASTFEEDING BASICS Breastfeeding Initiation  Find a comfortable place to sit or lie down, with your neck and back well supported.  Place a pillow or rolled up blanket under your baby to bring him or her to the level of your breast (if you are seated). Nursing pillows are specially designed to help support your arms and your baby while you breastfeed.  Make sure that your baby's abdomen is facing your abdomen.   Gently massage your breast. With your fingertips, massage from your chest   wall toward your nipple in a circular motion. This encourages milk flow. You may need to continue this action during the feeding if your milk flows slowly.  Support your breast with 4 fingers underneath and your thumb above your nipple. Make sure your fingers are well away from your nipple and your baby's mouth.   Stroke your baby's lips gently with your finger or nipple.   When your baby's mouth is open wide enough, quickly bring your baby to your breast, placing your entire nipple and as much of the colored area around your nipple (areola) as possible into your baby's mouth.   More areola should be visible above your baby's upper lip than below the lower lip.   Your baby's tongue should be between his or her lower gum and your breast.   Ensure that your baby's mouth is correctly positioned around your nipple (latched). Your baby's lips should create a seal on your breast and be turned out (everted).  It is common for your baby to suck about 2-3 minutes in order to start the flow of breast milk. Latching Teaching your baby how to latch on to your breast properly is very important. An improper latch can cause nipple pain and decreased milk supply for you and poor weight gain in your baby. Also, if your baby is not latched onto your nipple properly, he or she  may swallow some air during feeding. This can make your baby fussy. Burping your baby when you switch breasts during the feeding can help to get rid of the air. However, teaching your baby to latch on properly is still the best way to prevent fussiness from swallowing air while breastfeeding. Signs that your baby has successfully latched on to your nipple:    Silent tugging or silent sucking, without causing you pain.   Swallowing heard between every 3-4 sucks.    Muscle movement above and in front of his or her ears while sucking.  Signs that your baby has not successfully latched on to nipple:   Sucking sounds or smacking sounds from your baby while breastfeeding.  Nipple pain. If you think your baby has not latched on correctly, slip your finger into the corner of your baby's mouth to break the suction and place it between your baby's gums. Attempt breastfeeding initiation again. Signs of Successful Breastfeeding Signs from your baby:   A gradual decrease in the number of sucks or complete cessation of sucking.   Falling asleep.   Relaxation of his or her body.   Retention of a small amount of milk in his or her mouth.   Letting go of your breast by himself or herself. Signs from you:  Breasts that have increased in firmness, weight, and size 1-3 hours after feeding.   Breasts that are softer immediately after breastfeeding.  Increased milk volume, as well as a change in milk consistency and color by the fifth day of breastfeeding.   Nipples that are not sore, cracked, or bleeding. Signs That Your Baby is Getting Enough Milk  Wetting at least 3 diapers in a 24-hour period. The urine should be clear and pale yellow by age 5 days.  At least 3 stools in a 24-hour period by age 5 days. The stool should be soft and yellow.  At least 3 stools in a 24-hour period by age 7 days. The stool should be seedy and yellow.  No loss of weight greater than 10% of birth weight  during the first 3   days of age.  Average weight gain of 4-7 ounces (113-198 g) per week after age 4 days.  Consistent daily weight gain by age 5 days, without weight loss after the age of 2 weeks. After a feeding, your baby may spit up a small amount. This is common. BREASTFEEDING FREQUENCY AND DURATION Frequent feeding will help you make more milk and can prevent sore nipples and breast engorgement. Breastfeed when you feel the need to reduce the fullness of your breasts or when your baby shows signs of hunger. This is called "breastfeeding on demand." Avoid introducing a pacifier to your baby while you are working to establish breastfeeding (the first 4-6 weeks after your baby is born). After this time you may choose to use a pacifier. Research has shown that pacifier use during the first year of a baby's life decreases the risk of sudden infant death syndrome (SIDS). Allow your baby to feed on each breast as long as he or she wants. Breastfeed until your baby is finished feeding. When your baby unlatches or falls asleep while feeding from the first breast, offer the second breast. Because newborns are often sleepy in the first few weeks of life, you may need to awaken your baby to get him or her to feed. Breastfeeding times will vary from baby to baby. However, the following rules can serve as a guide to help you ensure that your baby is properly fed:  Newborns (babies 4 weeks of age or younger) may breastfeed every 1-3 hours.  Newborns should not go longer than 3 hours during the day or 5 hours during the night without breastfeeding.  You should breastfeed your baby a minimum of 8 times in a 24-hour period until you begin to introduce solid foods to your baby at around 6 months of age. BREAST MILK PUMPING Pumping and storing breast milk allows you to ensure that your baby is exclusively fed your breast milk, even at times when you are unable to breastfeed. This is especially important if you are  going back to work while you are still breastfeeding or when you are not able to be present during feedings. Your lactation consultant can give you guidelines on how long it is safe to store breast milk.  A breast pump is a machine that allows you to pump milk from your breast into a sterile bottle. The pumped breast milk can then be stored in a refrigerator or freezer. Some breast pumps are operated by hand, while others use electricity. Ask your lactation consultant which type will work best for you. Breast pumps can be purchased, but some hospitals and breastfeeding support groups lease breast pumps on a monthly basis. A lactation consultant can teach you how to hand express breast milk, if you prefer not to use a pump.  CARING FOR YOUR BREASTS WHILE YOU BREASTFEED Nipples can become dry, cracked, and sore while breastfeeding. The following recommendations can help keep your breasts moisturized and healthy:  Avoid using soap on your nipples.   Wear a supportive bra. Although not required, special nursing bras and tank tops are designed to allow access to your breasts for breastfeeding without taking off your entire bra or top. Avoid wearing underwire-style bras or extremely tight bras.  Air dry your nipples for 3-4minutes after each feeding.   Use only cotton bra pads to absorb leaked breast milk. Leaking of breast milk between feedings is normal.   Use lanolin on your nipples after breastfeeding. Lanolin helps to maintain your skin's   normal moisture barrier. If you use pure lanolin, you do not need to wash it off before feeding your baby again. Pure lanolin is not toxic to your baby. You may also hand express a few drops of breast milk and gently massage that milk into your nipples and allow the milk to air dry. In the first few weeks after giving birth, some women experience extremely full breasts (engorgement). Engorgement can make your breasts feel heavy, warm, and tender to the touch.  Engorgement peaks within 3-5 days after you give birth. The following recommendations can help ease engorgement:  Completely empty your breasts while breastfeeding or pumping. You may want to start by applying warm, moist heat (in the shower or with warm water-soaked hand towels) just before feeding or pumping. This increases circulation and helps the milk flow. If your baby does not completely empty your breasts while breastfeeding, pump any extra milk after he or she is finished.  Wear a snug bra (nursing or regular) or tank top for 1-2 days to signal your body to slightly decrease milk production.  Apply ice packs to your breasts, unless this is too uncomfortable for you.  Make sure that your baby is latched on and positioned properly while breastfeeding. If engorgement persists after 48 hours of following these recommendations, contact your health care provider or a lactation consultant. OVERALL HEALTH CARE RECOMMENDATIONS WHILE BREASTFEEDING  Eat healthy foods. Alternate between meals and snacks, eating 3 of each per day. Because what you eat affects your breast milk, some of the foods may make your baby more irritable than usual. Avoid eating these foods if you are sure that they are negatively affecting your baby.  Drink milk, fruit juice, and water to satisfy your thirst (about 10 glasses a day).   Rest often, relax, and continue to take your prenatal vitamins to prevent fatigue, stress, and anemia.  Continue breast self-awareness checks.  Avoid chewing and smoking tobacco.  Avoid alcohol and drug use. Some medicines that may be harmful to your baby can pass through breast milk. It is important to ask your health care provider before taking any medicine, including all over-the-counter and prescription medicine as well as vitamin and herbal supplements. It is possible to become pregnant while breastfeeding. If birth control is desired, ask your health care provider about options that  will be safe for your baby. SEEK MEDICAL CARE IF:   You feel like you want to stop breastfeeding or have become frustrated with breastfeeding.  You have painful breasts or nipples.  Your nipples are cracked or bleeding.  Your breasts are red, tender, or warm.  You have a swollen area on either breast.  You have a fever or chills.  You have nausea or vomiting.  You have drainage other than breast milk from your nipples.  Your breasts do not become full before feedings by the fifth day after you give birth.  You feel sad and depressed.  Your baby is too sleepy to eat well.  Your baby is having trouble sleeping.   Your baby is wetting less than 3 diapers in a 24-hour period.  Your baby has less than 3 stools in a 24-hour period.  Your baby's skin or the white part of his or her eyes becomes yellow.   Your baby is not gaining weight by 5 days of age. SEEK IMMEDIATE MEDICAL CARE IF:   Your baby is overly tired (lethargic) and does not want to wake up and feed.  Your baby   develops an unexplained fever. Document Released: 06/18/2005 Document Revised: 06/23/2013 Document Reviewed: 12/10/2012 [employer] Patient Information 2015 [employer]. This information is not intended to replace advice given to you by your health care provider. Make sure you discuss any questions you have with your health care provider.  

## 2014-08-07 LAB — WET PREP, GENITAL
Clue Cells Wet Prep HPF POC: NONE SEEN
Trich, Wet Prep: NONE SEEN
WBC, Wet Prep HPF POC: NONE SEEN
Yeast Wet Prep HPF POC: NONE SEEN

## 2014-08-10 ENCOUNTER — Ambulatory Visit (INDEPENDENT_AMBULATORY_CARE_PROVIDER_SITE_OTHER): Payer: Medicaid Other | Admitting: Family Medicine

## 2014-08-10 ENCOUNTER — Encounter: Payer: Self-pay | Admitting: Family Medicine

## 2014-08-10 VITALS — BP 114/75 | HR 109 | Wt 138.0 lb

## 2014-08-10 DIAGNOSIS — Z23 Encounter for immunization: Secondary | ICD-10-CM

## 2014-08-10 DIAGNOSIS — Z36 Encounter for antenatal screening of mother: Secondary | ICD-10-CM

## 2014-08-10 DIAGNOSIS — Z3483 Encounter for supervision of other normal pregnancy, third trimester: Secondary | ICD-10-CM

## 2014-08-10 DIAGNOSIS — Z3493 Encounter for supervision of normal pregnancy, unspecified, third trimester: Secondary | ICD-10-CM

## 2014-08-10 LAB — CBC WITH DIFFERENTIAL/PLATELET
BASOS ABS: 0 10*3/uL (ref 0.0–0.1)
Basophils Relative: 0 % (ref 0–1)
EOS PCT: 1 % (ref 0–5)
Eosinophils Absolute: 0.1 10*3/uL (ref 0.0–0.7)
HCT: 34.6 % — ABNORMAL LOW (ref 36.0–46.0)
Hemoglobin: 11.3 g/dL — ABNORMAL LOW (ref 12.0–15.0)
LYMPHS ABS: 1.3 10*3/uL (ref 0.7–4.0)
LYMPHS PCT: 23 % (ref 12–46)
MCH: 29.2 pg (ref 26.0–34.0)
MCHC: 32.7 g/dL (ref 30.0–36.0)
MCV: 89.4 fL (ref 78.0–100.0)
MONO ABS: 0.4 10*3/uL (ref 0.1–1.0)
MPV: 10.6 fL (ref 8.6–12.4)
Monocytes Relative: 8 % (ref 3–12)
Neutro Abs: 3.8 10*3/uL (ref 1.7–7.7)
Neutrophils Relative %: 68 % (ref 43–77)
PLATELETS: 176 10*3/uL (ref 150–400)
RBC: 3.87 MIL/uL (ref 3.87–5.11)
RDW: 13.5 % (ref 11.5–15.5)
WBC: 5.6 10*3/uL (ref 4.0–10.5)

## 2014-08-10 NOTE — Progress Notes (Signed)
She will see MFM on 2/16. U/S for growth, given lagging fundal height.

## 2014-08-10 NOTE — Patient Instructions (Signed)
Breastfeeding Deciding to breastfeed is one of the best choices you can make for you and your baby. A change in hormones during pregnancy causes your breast tissue to grow and increases the number and size of your milk ducts. These hormones also allow proteins, sugars, and fats from your blood supply to make breast milk in your milk-producing glands. Hormones prevent breast milk from being released before your baby is born as well as prompt milk flow after birth. Once breastfeeding has begun, thoughts of your baby, as well as his or her sucking or crying, can stimulate the release of milk from your milk-producing glands.  BENEFITS OF BREASTFEEDING For Your Baby  Your first milk (colostrum) helps your baby's digestive system function better.   There are antibodies in your milk that help your baby fight off infections.   Your baby has a lower incidence of asthma, allergies, and sudden infant death syndrome.   The nutrients in breast milk are better for your baby than infant formulas and are designed uniquely for your baby's needs.   Breast milk improves your baby's brain development.   Your baby is less likely to develop other conditions, such as childhood obesity, asthma, or type 2 diabetes mellitus.  For You   Breastfeeding helps to create a very special bond between you and your baby.   Breastfeeding is convenient. Breast milk is always available at the correct temperature and costs nothing.   Breastfeeding helps to burn calories and helps you lose the weight gained during pregnancy.   Breastfeeding makes your uterus contract to its prepregnancy size faster and slows bleeding (lochia) after you give birth.   Breastfeeding helps to lower your risk of developing type 2 diabetes mellitus, osteoporosis, and breast or ovarian cancer later in life. SIGNS THAT YOUR BABY IS HUNGRY Early Signs of Hunger  Increased alertness or activity.  Stretching.  Movement of the head from  side to side.  Movement of the head and opening of the mouth when the corner of the mouth or cheek is stroked (rooting).  Increased sucking sounds, smacking lips, cooing, sighing, or squeaking.  Hand-to-mouth movements.  Increased sucking of fingers or hands. Late Signs of Hunger  Fussing.  Intermittent crying. Extreme Signs of Hunger Signs of extreme hunger will require calming and consoling before your baby will be able to breastfeed successfully. Do not wait for the following signs of extreme hunger to occur before you initiate breastfeeding:   Restlessness.  A loud, strong cry.   Screaming. BREASTFEEDING BASICS Breastfeeding Initiation  Find a comfortable place to sit or lie down, with your neck and back well supported.  Place a pillow or rolled up blanket under your baby to bring him or her to the level of your breast (if you are seated). Nursing pillows are specially designed to help support your arms and your baby while you breastfeed.  Make sure that your baby's abdomen is facing your abdomen.   Gently massage your breast. With your fingertips, massage from your chest wall toward your nipple in a circular motion. This encourages milk flow. You may need to continue this action during the feeding if your milk flows slowly.  Support your breast with 4 fingers underneath and your thumb above your nipple. Make sure your fingers are well away from your nipple and your baby's mouth.   Stroke your baby's lips gently with your finger or nipple.   When your baby's mouth is open wide enough, quickly bring your baby to your   breast, placing your entire nipple and as much of the colored area around your nipple (areola) as possible into your baby's mouth.   More areola should be visible above your baby's upper lip than below the lower lip.   Your baby's tongue should be between his or her lower gum and your breast.   Ensure that your baby's mouth is correctly positioned  around your nipple (latched). Your baby's lips should create a seal on your breast and be turned out (everted).  It is common for your baby to suck about 2-3 minutes in order to start the flow of breast milk. Latching Teaching your baby how to latch on to your breast properly is very important. An improper latch can cause nipple pain and decreased milk supply for you and poor weight gain in your baby. Also, if your baby is not latched onto your nipple properly, he or she may swallow some air during feeding. This can make your baby fussy. Burping your baby when you switch breasts during the feeding can help to get rid of the air. However, teaching your baby to latch on properly is still the best way to prevent fussiness from swallowing air while breastfeeding. Signs that your baby has successfully latched on to your nipple:    Silent tugging or silent sucking, without causing you pain.   Swallowing heard between every 3-4 sucks.    Muscle movement above and in front of his or her ears while sucking.  Signs that your baby has not successfully latched on to nipple:   Sucking sounds or smacking sounds from your baby while breastfeeding.  Nipple pain. If you think your baby has not latched on correctly, slip your finger into the corner of your baby's mouth to break the suction and place it between your baby's gums. Attempt breastfeeding initiation again. Signs of Successful Breastfeeding Signs from your baby:   A gradual decrease in the number of sucks or complete cessation of sucking.   Falling asleep.   Relaxation of his or her body.   Retention of a small amount of milk in his or her mouth.   Letting go of your breast by himself or herself. Signs from you:  Breasts that have increased in firmness, weight, and size 1-3 hours after feeding.   Breasts that are softer immediately after breastfeeding.  Increased milk volume, as well as a change in milk consistency and color by  the fifth day of breastfeeding.   Nipples that are not sore, cracked, or bleeding. Signs That Your Baby is Getting Enough Milk  Wetting at least 3 diapers in a 24-hour period. The urine should be clear and pale yellow by age 5 days.  At least 3 stools in a 24-hour period by age 5 days. The stool should be soft and yellow.  At least 3 stools in a 24-hour period by age 7 days. The stool should be seedy and yellow.  No loss of weight greater than 10% of birth weight during the first 3 days of age.  Average weight gain of 4-7 ounces (113-198 g) per week after age 4 days.  Consistent daily weight gain by age 5 days, without weight loss after the age of 2 weeks. After a feeding, your baby may spit up a small amount. This is common. BREASTFEEDING FREQUENCY AND DURATION Frequent feeding will help you make more milk and can prevent sore nipples and breast engorgement. Breastfeed when you feel the need to reduce the fullness of your breasts   or when your baby shows signs of hunger. This is called "breastfeeding on demand." Avoid introducing a pacifier to your baby while you are working to establish breastfeeding (the first 4-6 weeks after your baby is born). After this time you may choose to use a pacifier. Research has shown that pacifier use during the first year of a baby's life decreases the risk of sudden infant death syndrome (SIDS). Allow your baby to feed on each breast as long as he or she wants. Breastfeed until your baby is finished feeding. When your baby unlatches or falls asleep while feeding from the first breast, offer the second breast. Because newborns are often sleepy in the first few weeks of life, you may need to awaken your baby to get him or her to feed. Breastfeeding times will vary from baby to baby. However, the following rules can serve as a guide to help you ensure that your baby is properly fed:  Newborns (babies 4 weeks of age or younger) may breastfeed every 1-3  hours.  Newborns should not go longer than 3 hours during the day or 5 hours during the night without breastfeeding.  You should breastfeed your baby a minimum of 8 times in a 24-hour period until you begin to introduce solid foods to your baby at around 6 months of age. BREAST MILK PUMPING Pumping and storing breast milk allows you to ensure that your baby is exclusively fed your breast milk, even at times when you are unable to breastfeed. This is especially important if you are going back to work while you are still breastfeeding or when you are not able to be present during feedings. Your lactation consultant can give you guidelines on how long it is safe to store breast milk.  A breast pump is a machine that allows you to pump milk from your breast into a sterile bottle. The pumped breast milk can then be stored in a refrigerator or freezer. Some breast pumps are operated by hand, while others use electricity. Ask your lactation consultant which type will work best for you. Breast pumps can be purchased, but some hospitals and breastfeeding support groups lease breast pumps on a monthly basis. A lactation consultant can teach you how to hand express breast milk, if you prefer not to use a pump.  CARING FOR YOUR BREASTS WHILE YOU BREASTFEED Nipples can become dry, cracked, and sore while breastfeeding. The following recommendations can help keep your breasts moisturized and healthy:  Avoid using soap on your nipples.   Wear a supportive bra. Although not required, special nursing bras and tank tops are designed to allow access to your breasts for breastfeeding without taking off your entire bra or top. Avoid wearing underwire-style bras or extremely tight bras.  Air dry your nipples for 3-4minutes after each feeding.   Use only cotton bra pads to absorb leaked breast milk. Leaking of breast milk between feedings is normal.   Use lanolin on your nipples after breastfeeding. Lanolin helps to  maintain your skin's normal moisture barrier. If you use pure lanolin, you do not need to wash it off before feeding your baby again. Pure lanolin is not toxic to your baby. You may also hand express a few drops of breast milk and gently massage that milk into your nipples and allow the milk to air dry. In the first few weeks after giving birth, some women experience extremely full breasts (engorgement). Engorgement can make your breasts feel heavy, warm, and tender to the   touch. Engorgement peaks within 3-5 days after you give birth. The following recommendations can help ease engorgement:  Completely empty your breasts while breastfeeding or pumping. You may want to start by applying warm, moist heat (in the shower or with warm water-soaked hand towels) just before feeding or pumping. This increases circulation and helps the milk flow. If your baby does not completely empty your breasts while breastfeeding, pump any extra milk after he or she is finished.  Wear a snug bra (nursing or regular) or tank top for 1-2 days to signal your body to slightly decrease milk production.  Apply ice packs to your breasts, unless this is too uncomfortable for you.  Make sure that your baby is latched on and positioned properly while breastfeeding. If engorgement persists after 48 hours of following these recommendations, contact your health care provider or a lactation consultant. OVERALL HEALTH CARE RECOMMENDATIONS WHILE BREASTFEEDING  Eat healthy foods. Alternate between meals and snacks, eating 3 of each per day. Because what you eat affects your breast milk, some of the foods may make your baby more irritable than usual. Avoid eating these foods if you are sure that they are negatively affecting your baby.  Drink milk, fruit juice, and water to satisfy your thirst (about 10 glasses a day).   Rest often, relax, and continue to take your prenatal vitamins to prevent fatigue, stress, and anemia.  Continue  breast self-awareness checks.  Avoid chewing and smoking tobacco.  Avoid alcohol and drug use. Some medicines that may be harmful to your baby can pass through breast milk. It is important to ask your health care provider before taking any medicine, including all over-the-counter and prescription medicine as well as vitamin and herbal supplements. It is possible to become pregnant while breastfeeding. If birth control is desired, ask your health care provider about options that will be safe for your baby. SEEK MEDICAL CARE IF:   You feel like you want to stop breastfeeding or have become frustrated with breastfeeding.  You have painful breasts or nipples.  Your nipples are cracked or bleeding.  Your breasts are red, tender, or warm.  You have a swollen area on either breast.  You have a fever or chills.  You have nausea or vomiting.  You have drainage other than breast milk from your nipples.  Your breasts do not become full before feedings by the fifth day after you give birth.  You feel sad and depressed.  Your baby is too sleepy to eat well.  Your baby is having trouble sleeping.   Your baby is wetting less than 3 diapers in a 24-hour period.  Your baby has less than 3 stools in a 24-hour period.  Your baby's skin or the white part of his or her eyes becomes yellow.   Your baby is not gaining weight by 5 days of age. SEEK IMMEDIATE MEDICAL CARE IF:   Your baby is overly tired (lethargic) and does not want to wake up and feed.  Your baby develops an unexplained fever. Document Released: 06/18/2005 Document Revised: 06/23/2013 Document Reviewed: 12/10/2012 Exit[employer] Patient Information 2015 [employer]. This information is not intended to replace advice given to you by your health care provider. Make sure you discuss any questions you have with your health care provider.  

## 2014-08-11 LAB — GLUCOSE TOLERANCE, 1 HOUR (50G) W/O FASTING: Glucose, 1 Hour GTT: 94 mg/dL (ref 70–140)

## 2014-08-11 LAB — RPR

## 2014-08-11 LAB — HIV ANTIBODY (ROUTINE TESTING W REFLEX): HIV 1&2 Ab, 4th Generation: NONREACTIVE

## 2014-08-17 ENCOUNTER — Ambulatory Visit (HOSPITAL_COMMUNITY): Admission: RE | Admit: 2014-08-17 | Payer: Medicaid Other | Source: Ambulatory Visit

## 2014-08-17 ENCOUNTER — Encounter (HOSPITAL_COMMUNITY): Payer: Self-pay

## 2014-08-17 ENCOUNTER — Ambulatory Visit (HOSPITAL_COMMUNITY)
Admission: RE | Admit: 2014-08-17 | Discharge: 2014-08-17 | Disposition: A | Discharge status: Home / Self Care | Payer: Medicaid Other | Source: Ambulatory Visit | Attending: Family Medicine | Admitting: Family Medicine

## 2014-08-17 DIAGNOSIS — O358XX0 Maternal care for other (suspected) fetal abnormality and damage, not applicable or unspecified: Secondary | ICD-10-CM | POA: Insufficient documentation

## 2014-08-17 DIAGNOSIS — O26849 Uterine size-date discrepancy, unspecified trimester: Secondary | ICD-10-CM | POA: Insufficient documentation

## 2014-08-17 DIAGNOSIS — Z3A30 30 weeks gestation of pregnancy: Secondary | ICD-10-CM | POA: Insufficient documentation

## 2014-08-17 DIAGNOSIS — Z3A31 31 weeks gestation of pregnancy: Secondary | ICD-10-CM

## 2014-08-17 DIAGNOSIS — O26843 Uterine size-date discrepancy, third trimester: Secondary | ICD-10-CM | POA: Diagnosis present

## 2014-08-17 DIAGNOSIS — O283 Abnormal ultrasonic finding on antenatal screening of mother: Secondary | ICD-10-CM

## 2014-08-17 DIAGNOSIS — O09523 Supervision of elderly multigravida, third trimester: Secondary | ICD-10-CM | POA: Insufficient documentation

## 2014-08-17 DIAGNOSIS — O09529 Supervision of elderly multigravida, unspecified trimester: Secondary | ICD-10-CM | POA: Insufficient documentation

## 2014-08-17 DIAGNOSIS — O09522 Supervision of elderly multigravida, second trimester: Secondary | ICD-10-CM

## 2014-08-24 ENCOUNTER — Ambulatory Visit (INDEPENDENT_AMBULATORY_CARE_PROVIDER_SITE_OTHER): Payer: Medicaid Other | Admitting: Obstetrics & Gynecology

## 2014-08-24 ENCOUNTER — Encounter: Payer: Self-pay | Admitting: Obstetrics & Gynecology

## 2014-08-24 VITALS — BP 111/66 | HR 108 | Wt 140.0 lb

## 2014-08-24 DIAGNOSIS — Z3483 Encounter for supervision of other normal pregnancy, third trimester: Secondary | ICD-10-CM

## 2014-08-24 NOTE — Progress Notes (Signed)
Routine visit. Good FM. No problems. We discussed circumcision. Wants a BTL.

## 2014-09-07 ENCOUNTER — Encounter: Payer: Medicaid Other | Admitting: Family Medicine

## 2014-09-10 ENCOUNTER — Ambulatory Visit (INDEPENDENT_AMBULATORY_CARE_PROVIDER_SITE_OTHER): Payer: Medicaid Other | Admitting: Family Medicine

## 2014-09-10 ENCOUNTER — Encounter: Payer: Self-pay | Admitting: Family Medicine

## 2014-09-10 VITALS — BP 115/65 | HR 102 | Wt 145.8 lb

## 2014-09-10 DIAGNOSIS — Z3483 Encounter for supervision of other normal pregnancy, third trimester: Secondary | ICD-10-CM

## 2014-09-10 NOTE — Patient Instructions (Signed)
Third Trimester of Pregnancy The third trimester is from week 29 through week 42, months 7 through 9. The third trimester is a time when the fetus is growing rapidly. At the end of the ninth month, the fetus is about 20 inches in length and weighs 6-10 pounds.  BODY CHANGES Your body goes through many changes during pregnancy. The changes vary from woman to woman.   Your weight will continue to increase. You can expect to gain 25-35 pounds (11-16 kg) by the end of the pregnancy.  You may begin to get stretch marks on your hips, abdomen, and breasts.  You may urinate more often because the fetus is moving lower into your pelvis and pressing on your bladder.  You may develop or continue to have heartburn as a result of your pregnancy.  You may develop constipation because certain hormones are causing the muscles that push waste through your intestines to slow down.  You may develop hemorrhoids or swollen, bulging veins (varicose veins).  You may have pelvic pain because of the weight gain and pregnancy hormones relaxing your joints between the bones in your pelvis. Backaches may result from overexertion of the muscles supporting your posture.  You may have changes in your hair. These can include thickening of your hair, rapid growth, and changes in texture. Some women also have hair loss during or after pregnancy, or hair that feels dry or thin. Your hair will most likely return to normal after your baby is born.  Your breasts will continue to grow and be tender. A yellow discharge may leak from your breasts called colostrum.  Your belly button may stick out.  You may feel short of breath because of your expanding uterus.  You may notice the fetus "dropping," or moving lower in your abdomen.  You may have a bloody mucus discharge. This usually occurs a few days to a week before labor begins.  Your cervix becomes thin and soft (effaced) near your due date. WHAT TO EXPECT AT YOUR  PRENATAL EXAMS  You will have prenatal exams every 2 weeks until week 36. Then, you will have weekly prenatal exams. During a routine prenatal visit:  You will be weighed to make sure you and the fetus are growing normally.  Your blood pressure is taken.  Your abdomen will be measured to track your baby's growth.  The fetal heartbeat will be listened to.  Any test results from the previous visit will be discussed.  You may have a cervical check near your due date to see if you have effaced. At around 36 weeks, your caregiver will check your cervix. At the same time, your caregiver will also perform a test on the secretions of the vaginal tissue. This test is to determine if a type of bacteria, Group B streptococcus, is present. Your caregiver will explain this further. Your caregiver may ask you:  What your birth plan is.  How you are feeling.  If you are feeling the baby move.  If you have had any abnormal symptoms, such as leaking fluid, bleeding, severe headaches, or abdominal cramping.  If you have any questions. Other tests or screenings that may be performed during your third trimester include:  Blood tests that check for low iron levels (anemia).  Fetal testing to check the health, activity level, and growth of the fetus. Testing is done if you have certain medical conditions or if there are problems during the pregnancy. FALSE LABOR You may feel small, irregular contractions that   eventually go away. These are called Braxton Hicks contractions, or false labor. Contractions may last for hours, days, or even weeks before true labor sets in. If contractions come at regular intervals, intensify, or become painful, it is best to be seen by your caregiver.  SIGNS OF LABOR   Menstrual-like cramps.  Contractions that are 5 minutes apart or less.  Contractions that start on the top of the uterus and spread down to the lower abdomen and back.  A sense of increased pelvic  pressure or back pain.  A watery or bloody mucus discharge that comes from the vagina. If you have any of these signs before the 37th week of pregnancy, call your caregiver right away. You need to go to the hospital to get checked immediately. HOME CARE INSTRUCTIONS   Avoid all smoking, herbs, alcohol, and unprescribed drugs. These chemicals affect the formation and growth of the baby.  Follow your caregiver's instructions regarding medicine use. There are medicines that are either safe or unsafe to take during pregnancy.  Exercise only as directed by your caregiver. Experiencing uterine cramps is a good sign to stop exercising.  Continue to eat regular, healthy meals.  Wear a good support bra for breast tenderness.  Do not use hot tubs, steam rooms, or saunas.  Wear your seat belt at all times when driving.  Avoid raw meat, uncooked cheese, cat litter boxes, and soil used by cats. These carry germs that can cause birth defects in the baby.  Take your prenatal vitamins.  Try taking a stool softener (if your caregiver approves) if you develop constipation. Eat more high-fiber foods, such as fresh vegetables or fruit and whole grains. Drink plenty of fluids to keep your urine clear or pale yellow.  Take warm sitz baths to soothe any pain or discomfort caused by hemorrhoids. Use hemorrhoid cream if your caregiver approves.  If you develop varicose veins, wear support hose. Elevate your feet for 15 minutes, 3-4 times a day. Limit salt in your diet.  Avoid heavy lifting, wear low heal shoes, and practice good posture.  Rest a lot with your legs elevated if you have leg cramps or low back pain.  Visit your dentist if you have not gone during your pregnancy. Use a soft toothbrush to brush your teeth and be gentle when you floss.  A sexual relationship may be continued unless your caregiver directs you otherwise.  Do not travel far distances unless it is absolutely necessary and only  with the approval of your caregiver.  Take prenatal classes to understand, practice, and ask questions about the labor and delivery.  Make a trial run to the hospital.  Pack your hospital bag.  Prepare the baby's nursery.  Continue to go to all your prenatal visits as directed by your caregiver. SEEK MEDICAL CARE IF:  You are unsure if you are in labor or if your water has broken.  You have dizziness.  You have mild pelvic cramps, pelvic pressure, or nagging pain in your abdominal area.  You have persistent nausea, vomiting, or diarrhea.  You have a bad smelling vaginal discharge.  You have pain with urination. SEEK IMMEDIATE MEDICAL CARE IF:   You have a fever.  You are leaking fluid from your vagina.  You have spotting or bleeding from your vagina.  You have severe abdominal cramping or pain.  You have rapid weight loss or gain.  You have shortness of breath with chest pain.  You notice sudden or extreme swelling   of your face, hands, ankles, feet, or legs.  You have not felt your baby move in over an hour.  You have severe headaches that do not go away with medicine.  You have vision changes. Document Released: 06/12/2001 Document Revised: 06/23/2013 Document Reviewed: 08/19/2012 [employer] Patient Information 2015 [employer]. This information is not intended to replace advice given to you by your health care provider. Make sure you discuss any questions you have with your health care provider.  Breastfeeding Deciding to breastfeed is one of the best choices you can make for you and your baby. A change in hormones during pregnancy causes your breast tissue to grow and increases the number and size of your milk ducts. These hormones also allow proteins, sugars, and fats from your blood supply to make breast milk in your milk-producing glands. Hormones prevent breast milk from being released before your baby is born as well as prompt milk flow after birth. Once  breastfeeding has begun, thoughts of your baby, as well as his or her sucking or crying, can stimulate the release of milk from your milk-producing glands.  BENEFITS OF BREASTFEEDING For Your Baby  Your first milk (colostrum) helps your baby's digestive system function better.   There are antibodies in your milk that help your baby fight off infections.   Your baby has a lower incidence of asthma, allergies, and sudden infant death syndrome.   The nutrients in breast milk are better for your baby than infant formulas and are designed uniquely for your baby's needs.   Breast milk improves your baby's brain development.   Your baby is less likely to develop other conditions, such as childhood obesity, asthma, or type 2 diabetes mellitus.  For You   Breastfeeding helps to create a very special bond between you and your baby.   Breastfeeding is convenient. Breast milk is always available at the correct temperature and costs nothing.   Breastfeeding helps to burn calories and helps you lose the weight gained during pregnancy.   Breastfeeding makes your uterus contract to its prepregnancy size faster and slows bleeding (lochia) after you give birth.   Breastfeeding helps to lower your risk of developing type 2 diabetes mellitus, osteoporosis, and breast or ovarian cancer later in life. SIGNS THAT YOUR BABY IS HUNGRY Early Signs of Hunger  Increased alertness or activity.  Stretching.  Movement of the head from side to side.  Movement of the head and opening of the mouth when the corner of the mouth or cheek is stroked (rooting).  Increased sucking sounds, smacking lips, cooing, sighing, or squeaking.  Hand-to-mouth movements.  Increased sucking of fingers or hands. Late Signs of Hunger  Fussing.  Intermittent crying. Extreme Signs of Hunger Signs of extreme hunger will require calming and consoling before your baby will be able to breastfeed successfully. Do not  wait for the following signs of extreme hunger to occur before you initiate breastfeeding:   Restlessness.  A loud, strong cry.   Screaming. BREASTFEEDING BASICS Breastfeeding Initiation  Find a comfortable place to sit or lie down, with your neck and back well supported.  Place a pillow or rolled up blanket under your baby to bring him or her to the level of your breast (if you are seated). Nursing pillows are specially designed to help support your arms and your baby while you breastfeed.  Make sure that your baby's abdomen is facing your abdomen.   Gently massage your breast. With your fingertips, massage from your chest   wall toward your nipple in a circular motion. This encourages milk flow. You may need to continue this action during the feeding if your milk flows slowly.  Support your breast with 4 fingers underneath and your thumb above your nipple. Make sure your fingers are well away from your nipple and your baby's mouth.   Stroke your baby's lips gently with your finger or nipple.   When your baby's mouth is open wide enough, quickly bring your baby to your breast, placing your entire nipple and as much of the colored area around your nipple (areola) as possible into your baby's mouth.   More areola should be visible above your baby's upper lip than below the lower lip.   Your baby's tongue should be between his or her lower gum and your breast.   Ensure that your baby's mouth is correctly positioned around your nipple (latched). Your baby's lips should create a seal on your breast and be turned out (everted).  It is common for your baby to suck about 2-3 minutes in order to start the flow of breast milk. Latching Teaching your baby how to latch on to your breast properly is very important. An improper latch can cause nipple pain and decreased milk supply for you and poor weight gain in your baby. Also, if your baby is not latched onto your nipple properly, he or she  may swallow some air during feeding. This can make your baby fussy. Burping your baby when you switch breasts during the feeding can help to get rid of the air. However, teaching your baby to latch on properly is still the best way to prevent fussiness from swallowing air while breastfeeding. Signs that your baby has successfully latched on to your nipple:    Silent tugging or silent sucking, without causing you pain.   Swallowing heard between every 3-4 sucks.    Muscle movement above and in front of his or her ears while sucking.  Signs that your baby has not successfully latched on to nipple:   Sucking sounds or smacking sounds from your baby while breastfeeding.  Nipple pain. If you think your baby has not latched on correctly, slip your finger into the corner of your baby's mouth to break the suction and place it between your baby's gums. Attempt breastfeeding initiation again. Signs of Successful Breastfeeding Signs from your baby:   A gradual decrease in the number of sucks or complete cessation of sucking.   Falling asleep.   Relaxation of his or her body.   Retention of a small amount of milk in his or her mouth.   Letting go of your breast by himself or herself. Signs from you:  Breasts that have increased in firmness, weight, and size 1-3 hours after feeding.   Breasts that are softer immediately after breastfeeding.  Increased milk volume, as well as a change in milk consistency and color by the fifth day of breastfeeding.   Nipples that are not sore, cracked, or bleeding. Signs That Your Baby is Getting Enough Milk  Wetting at least 3 diapers in a 24-hour period. The urine should be clear and pale yellow by age 5 days.  At least 3 stools in a 24-hour period by age 5 days. The stool should be soft and yellow.  At least 3 stools in a 24-hour period by age 7 days. The stool should be seedy and yellow.  No loss of weight greater than 10% of birth weight  during the first 3   days of age.  Average weight gain of 4-7 ounces (113-198 g) per week after age 4 days.  Consistent daily weight gain by age 5 days, without weight loss after the age of 2 weeks. After a feeding, your baby may spit up a small amount. This is common. BREASTFEEDING FREQUENCY AND DURATION Frequent feeding will help you make more milk and can prevent sore nipples and breast engorgement. Breastfeed when you feel the need to reduce the fullness of your breasts or when your baby shows signs of hunger. This is called "breastfeeding on demand." Avoid introducing a pacifier to your baby while you are working to establish breastfeeding (the first 4-6 weeks after your baby is born). After this time you may choose to use a pacifier. Research has shown that pacifier use during the first year of a baby's life decreases the risk of sudden infant death syndrome (SIDS). Allow your baby to feed on each breast as long as he or she wants. Breastfeed until your baby is finished feeding. When your baby unlatches or falls asleep while feeding from the first breast, offer the second breast. Because newborns are often sleepy in the first few weeks of life, you may need to awaken your baby to get him or her to feed. Breastfeeding times will vary from baby to baby. However, the following rules can serve as a guide to help you ensure that your baby is properly fed:  Newborns (babies 4 weeks of age or younger) may breastfeed every 1-3 hours.  Newborns should not go longer than 3 hours during the day or 5 hours during the night without breastfeeding.  You should breastfeed your baby a minimum of 8 times in a 24-hour period until you begin to introduce solid foods to your baby at around 6 months of age. BREAST MILK PUMPING Pumping and storing breast milk allows you to ensure that your baby is exclusively fed your breast milk, even at times when you are unable to breastfeed. This is especially important if you are  going back to work while you are still breastfeeding or when you are not able to be present during feedings. Your lactation consultant can give you guidelines on how long it is safe to store breast milk.  A breast pump is a machine that allows you to pump milk from your breast into a sterile bottle. The pumped breast milk can then be stored in a refrigerator or freezer. Some breast pumps are operated by hand, while others use electricity. Ask your lactation consultant which type will work best for you. Breast pumps can be purchased, but some hospitals and breastfeeding support groups lease breast pumps on a monthly basis. A lactation consultant can teach you how to hand express breast milk, if you prefer not to use a pump.  CARING FOR YOUR BREASTS WHILE YOU BREASTFEED Nipples can become dry, cracked, and sore while breastfeeding. The following recommendations can help keep your breasts moisturized and healthy:  Avoid using soap on your nipples.   Wear a supportive bra. Although not required, special nursing bras and tank tops are designed to allow access to your breasts for breastfeeding without taking off your entire bra or top. Avoid wearing underwire-style bras or extremely tight bras.  Air dry your nipples for 3-4minutes after each feeding.   Use only cotton bra pads to absorb leaked breast milk. Leaking of breast milk between feedings is normal.   Use lanolin on your nipples after breastfeeding. Lanolin helps to maintain your skin's   normal moisture barrier. If you use pure lanolin, you do not need to wash it off before feeding your baby again. Pure lanolin is not toxic to your baby. You may also hand express a few drops of breast milk and gently massage that milk into your nipples and allow the milk to air dry. In the first few weeks after giving birth, some women experience extremely full breasts (engorgement). Engorgement can make your breasts feel heavy, warm, and tender to the touch.  Engorgement peaks within 3-5 days after you give birth. The following recommendations can help ease engorgement:  Completely empty your breasts while breastfeeding or pumping. You may want to start by applying warm, moist heat (in the shower or with warm water-soaked hand towels) just before feeding or pumping. This increases circulation and helps the milk flow. If your baby does not completely empty your breasts while breastfeeding, pump any extra milk after he or she is finished.  Wear a snug bra (nursing or regular) or tank top for 1-2 days to signal your body to slightly decrease milk production.  Apply ice packs to your breasts, unless this is too uncomfortable for you.  Make sure that your baby is latched on and positioned properly while breastfeeding. If engorgement persists after 48 hours of following these recommendations, contact your health care provider or a lactation consultant. OVERALL HEALTH CARE RECOMMENDATIONS WHILE BREASTFEEDING  Eat healthy foods. Alternate between meals and snacks, eating 3 of each per day. Because what you eat affects your breast milk, some of the foods may make your baby more irritable than usual. Avoid eating these foods if you are sure that they are negatively affecting your baby.  Drink milk, fruit juice, and water to satisfy your thirst (about 10 glasses a day).   Rest often, relax, and continue to take your prenatal vitamins to prevent fatigue, stress, and anemia.  Continue breast self-awareness checks.  Avoid chewing and smoking tobacco.  Avoid alcohol and drug use. Some medicines that may be harmful to your baby can pass through breast milk. It is important to ask your health care provider before taking any medicine, including all over-the-counter and prescription medicine as well as vitamin and herbal supplements. It is possible to become pregnant while breastfeeding. If birth control is desired, ask your health care provider about options that  will be safe for your baby. SEEK MEDICAL CARE IF:   You feel like you want to stop breastfeeding or have become frustrated with breastfeeding.  You have painful breasts or nipples.  Your nipples are cracked or bleeding.  Your breasts are red, tender, or warm.  You have a swollen area on either breast.  You have a fever or chills.  You have nausea or vomiting.  You have drainage other than breast milk from your nipples.  Your breasts do not become full before feedings by the fifth day after you give birth.  You feel sad and depressed.  Your baby is too sleepy to eat well.  Your baby is having trouble sleeping.   Your baby is wetting less than 3 diapers in a 24-hour period.  Your baby has less than 3 stools in a 24-hour period.  Your baby's skin or the white part of his or her eyes becomes yellow.   Your baby is not gaining weight by 5 days of age. SEEK IMMEDIATE MEDICAL CARE IF:   Your baby is overly tired (lethargic) and does not want to wake up and feed.  Your baby   develops an unexplained fever. Document Released: 06/18/2005 Document Revised: 06/23/2013 Document Reviewed: 12/10/2012 [employer] Patient Information 2015 [employer]. This information is not intended to replace advice given to you by your health care provider. Make sure you discuss any questions you have with your health care provider.  

## 2014-09-10 NOTE — Progress Notes (Signed)
Doing well Excellent fetal movement. No labor signs

## 2014-09-23 ENCOUNTER — Other Ambulatory Visit: Payer: Self-pay | Admitting: Obstetrics & Gynecology

## 2014-09-23 ENCOUNTER — Ambulatory Visit (INDEPENDENT_AMBULATORY_CARE_PROVIDER_SITE_OTHER): Payer: Medicaid Other | Admitting: Obstetrics & Gynecology

## 2014-09-23 VITALS — BP 108/71 | HR 90 | Wt 145.5 lb

## 2014-09-23 DIAGNOSIS — Z36 Encounter for antenatal screening of mother: Secondary | ICD-10-CM

## 2014-09-23 DIAGNOSIS — O365931 Maternal care for other known or suspected poor fetal growth, third trimester, fetus 1: Secondary | ICD-10-CM

## 2014-09-23 DIAGNOSIS — Z3483 Encounter for supervision of other normal pregnancy, third trimester: Secondary | ICD-10-CM

## 2014-09-23 LAB — OB RESULTS CONSOLE GC/CHLAMYDIA
Chlamydia: NEGATIVE
GC PROBE AMP, GENITAL: NEGATIVE

## 2014-09-23 NOTE — Patient Instructions (Signed)
Return to clinic for any obstetric concerns or go to [facility] for evaluation  

## 2014-09-23 NOTE — Progress Notes (Signed)
Pelvic cultures done today; GBS with sensitivities done due to PCN allergy Growth scan ordered given size << dates, will follow up No other complaints or concerns.  Labor and fetal movement precautions reviewed.

## 2014-09-24 LAB — GC/CHLAMYDIA PROBE AMP
CT Probe RNA: NEGATIVE
GC Probe RNA: NEGATIVE

## 2014-09-25 LAB — CULTURE, STREPTOCOCCUS GRP B W/SUSCEPT

## 2014-09-27 ENCOUNTER — Encounter: Payer: Self-pay | Admitting: Obstetrics & Gynecology

## 2014-09-27 DIAGNOSIS — R8271 Bacteriuria: Secondary | ICD-10-CM | POA: Insufficient documentation

## 2014-09-29 ENCOUNTER — Ambulatory Visit (HOSPITAL_COMMUNITY)
Admission: RE | Admit: 2014-09-29 | Discharge: 2014-09-29 | Disposition: A | Discharge status: Home / Self Care | Payer: Medicaid Other | Source: Ambulatory Visit | Attending: Obstetrics & Gynecology | Admitting: Obstetrics & Gynecology

## 2014-09-29 DIAGNOSIS — Z3483 Encounter for supervision of other normal pregnancy, third trimester: Secondary | ICD-10-CM

## 2014-09-29 DIAGNOSIS — O365931 Maternal care for other known or suspected poor fetal growth, third trimester, fetus 1: Secondary | ICD-10-CM

## 2014-09-29 DIAGNOSIS — O09523 Supervision of elderly multigravida, third trimester: Secondary | ICD-10-CM | POA: Diagnosis not present

## 2014-09-29 DIAGNOSIS — Z3A37 37 weeks gestation of pregnancy: Secondary | ICD-10-CM | POA: Diagnosis not present

## 2014-09-29 DIAGNOSIS — O26843 Uterine size-date discrepancy, third trimester: Secondary | ICD-10-CM | POA: Diagnosis not present

## 2014-09-29 DIAGNOSIS — O365990 Maternal care for other known or suspected poor fetal growth, unspecified trimester, not applicable or unspecified: Secondary | ICD-10-CM | POA: Insufficient documentation

## 2014-09-29 DIAGNOSIS — O358XX0 Maternal care for other (suspected) fetal abnormality and damage, not applicable or unspecified: Secondary | ICD-10-CM | POA: Insufficient documentation

## 2014-09-30 ENCOUNTER — Ambulatory Visit (INDEPENDENT_AMBULATORY_CARE_PROVIDER_SITE_OTHER): Payer: Medicaid Other | Admitting: Obstetrics & Gynecology

## 2014-09-30 VITALS — BP 117/74 | HR 86 | Wt 146.0 lb

## 2014-09-30 DIAGNOSIS — Z3482 Encounter for supervision of other normal pregnancy, second trimester: Secondary | ICD-10-CM

## 2014-09-30 NOTE — Progress Notes (Signed)
Routine visit. Good FM. No problems. U/S showed normal fluid and 58% growth. Labor precautions reviewed.

## 2014-10-07 ENCOUNTER — Ambulatory Visit (INDEPENDENT_AMBULATORY_CARE_PROVIDER_SITE_OTHER): Payer: Medicaid Other | Admitting: Family Medicine

## 2014-10-07 VITALS — BP 107/70 | HR 93

## 2014-10-07 DIAGNOSIS — Z3482 Encounter for supervision of other normal pregnancy, second trimester: Secondary | ICD-10-CM

## 2014-10-07 NOTE — Progress Notes (Signed)
Labor precautions

## 2014-10-07 NOTE — Patient Instructions (Signed)
Third Trimester of Pregnancy The third trimester is from week 29 through week 42, months 7 through 9. The third trimester is a time when the fetus is growing rapidly. At the end of the ninth month, the fetus is about 20 inches in length and weighs 6-10 pounds.  BODY CHANGES Your body goes through many changes during pregnancy. The changes vary from woman to woman.   Your weight will continue to increase. You can expect to gain 25-35 pounds (11-16 kg) by the end of the pregnancy.  You may begin to get stretch marks on your hips, abdomen, and breasts.  You may urinate more often because the fetus is moving lower into your pelvis and pressing on your bladder.  You may develop or continue to have heartburn as a result of your pregnancy.  You may develop constipation because certain hormones are causing the muscles that push waste through your intestines to slow down.  You may develop hemorrhoids or swollen, bulging veins (varicose veins).  You may have pelvic pain because of the weight gain and pregnancy hormones relaxing your joints between the bones in your pelvis. Backaches may result from overexertion of the muscles supporting your posture.  You may have changes in your hair. These can include thickening of your hair, rapid growth, and changes in texture. Some women also have hair loss during or after pregnancy, or hair that feels dry or thin. Your hair will most likely return to normal after your baby is born.  Your breasts will continue to grow and be tender. A yellow discharge may leak from your breasts called colostrum.  Your belly button may stick out.  You may feel short of breath because of your expanding uterus.  You may notice the fetus "dropping," or moving lower in your abdomen.  You may have a bloody mucus discharge. This usually occurs a few days to a week before labor begins.  Your cervix becomes thin and soft (effaced) near your due date. WHAT TO EXPECT AT YOUR  PRENATAL EXAMS  You will have prenatal exams every 2 weeks until week 36. Then, you will have weekly prenatal exams. During a routine prenatal visit:  You will be weighed to make sure you and the fetus are growing normally.  Your blood pressure is taken.  Your abdomen will be measured to track your baby's growth.  The fetal heartbeat will be listened to.  Any test results from the previous visit will be discussed.  You may have a cervical check near your due date to see if you have effaced. At around 36 weeks, your caregiver will check your cervix. At the same time, your caregiver will also perform a test on the secretions of the vaginal tissue. This test is to determine if a type of bacteria, Group B streptococcus, is present. Your caregiver will explain this further. Your caregiver may ask you:  What your birth plan is.  How you are feeling.  If you are feeling the baby move.  If you have had any abnormal symptoms, such as leaking fluid, bleeding, severe headaches, or abdominal cramping.  If you have any questions. Other tests or screenings that may be performed during your third trimester include:  Blood tests that check for low iron levels (anemia).  Fetal testing to check the health, activity level, and growth of the fetus. Testing is done if you have certain medical conditions or if there are problems during the pregnancy. FALSE LABOR You may feel small, irregular contractions that   eventually go away. These are called Braxton Hicks contractions, or false labor. Contractions may last for hours, days, or even weeks before true labor sets in. If contractions come at regular intervals, intensify, or become painful, it is best to be seen by your caregiver.  SIGNS OF LABOR   Menstrual-like cramps.  Contractions that are 5 minutes apart or less.  Contractions that start on the top of the uterus and spread down to the lower abdomen and back.  A sense of increased pelvic  pressure or back pain.  A watery or bloody mucus discharge that comes from the vagina. If you have any of these signs before the 37th week of pregnancy, call your caregiver right away. You need to go to the hospital to get checked immediately. HOME CARE INSTRUCTIONS   Avoid all smoking, herbs, alcohol, and unprescribed drugs. These chemicals affect the formation and growth of the baby.  Follow your caregiver's instructions regarding medicine use. There are medicines that are either safe or unsafe to take during pregnancy.  Exercise only as directed by your caregiver. Experiencing uterine cramps is a good sign to stop exercising.  Continue to eat regular, healthy meals.  Wear a good support bra for breast tenderness.  Do not use hot tubs, steam rooms, or saunas.  Wear your seat belt at all times when driving.  Avoid raw meat, uncooked cheese, cat litter boxes, and soil used by cats. These carry germs that can cause birth defects in the baby.  Take your prenatal vitamins.  Try taking a stool softener (if your caregiver approves) if you develop constipation. Eat more high-fiber foods, such as fresh vegetables or fruit and whole grains. Drink plenty of fluids to keep your urine clear or pale yellow.  Take warm sitz baths to soothe any pain or discomfort caused by hemorrhoids. Use hemorrhoid cream if your caregiver approves.  If you develop varicose veins, wear support hose. Elevate your feet for 15 minutes, 3-4 times a day. Limit salt in your diet.  Avoid heavy lifting, wear low heal shoes, and practice good posture.  Rest a lot with your legs elevated if you have leg cramps or low back pain.  Visit your dentist if you have not gone during your pregnancy. Use a soft toothbrush to brush your teeth and be gentle when you floss.  A sexual relationship may be continued unless your caregiver directs you otherwise.  Do not travel far distances unless it is absolutely necessary and only  with the approval of your caregiver.  Take prenatal classes to understand, practice, and ask questions about the labor and delivery.  Make a trial run to the hospital.  Pack your hospital bag.  Prepare the baby's nursery.  Continue to go to all your prenatal visits as directed by your caregiver. SEEK MEDICAL CARE IF:  You are unsure if you are in labor or if your water has broken.  You have dizziness.  You have mild pelvic cramps, pelvic pressure, or nagging pain in your abdominal area.  You have persistent nausea, vomiting, or diarrhea.  You have a bad smelling vaginal discharge.  You have pain with urination. SEEK IMMEDIATE MEDICAL CARE IF:   You have a fever.  You are leaking fluid from your vagina.  You have spotting or bleeding from your vagina.  You have severe abdominal cramping or pain.  You have rapid weight loss or gain.  You have shortness of breath with chest pain.  You notice sudden or extreme swelling   of your face, hands, ankles, feet, or legs.  You have not felt your baby move in over an hour.  You have severe headaches that do not go away with medicine.  You have vision changes. Document Released: 06/12/2001 Document Revised: 06/23/2013 Document Reviewed: 08/19/2012 [employer] Patient Information 2015 [employer]. This information is not intended to replace advice given to you by your health care provider. Make sure you discuss any questions you have with your health care provider.  Breastfeeding Deciding to breastfeed is one of the best choices you can make for you and your baby. A change in hormones during pregnancy causes your breast tissue to grow and increases the number and size of your milk ducts. These hormones also allow proteins, sugars, and fats from your blood supply to make breast milk in your milk-producing glands. Hormones prevent breast milk from being released before your baby is born as well as prompt milk flow after birth. Once  breastfeeding has begun, thoughts of your baby, as well as his or her sucking or crying, can stimulate the release of milk from your milk-producing glands.  BENEFITS OF BREASTFEEDING For Your Baby  Your first milk (colostrum) helps your baby's digestive system function better.   There are antibodies in your milk that help your baby fight off infections.   Your baby has a lower incidence of asthma, allergies, and sudden infant death syndrome.   The nutrients in breast milk are better for your baby than infant formulas and are designed uniquely for your baby's needs.   Breast milk improves your baby's brain development.   Your baby is less likely to develop other conditions, such as childhood obesity, asthma, or type 2 diabetes mellitus.  For You   Breastfeeding helps to create a very special bond between you and your baby.   Breastfeeding is convenient. Breast milk is always available at the correct temperature and costs nothing.   Breastfeeding helps to burn calories and helps you lose the weight gained during pregnancy.   Breastfeeding makes your uterus contract to its prepregnancy size faster and slows bleeding (lochia) after you give birth.   Breastfeeding helps to lower your risk of developing type 2 diabetes mellitus, osteoporosis, and breast or ovarian cancer later in life. SIGNS THAT YOUR BABY IS HUNGRY Early Signs of Hunger  Increased alertness or activity.  Stretching.  Movement of the head from side to side.  Movement of the head and opening of the mouth when the corner of the mouth or cheek is stroked (rooting).  Increased sucking sounds, smacking lips, cooing, sighing, or squeaking.  Hand-to-mouth movements.  Increased sucking of fingers or hands. Late Signs of Hunger  Fussing.  Intermittent crying. Extreme Signs of Hunger Signs of extreme hunger will require calming and consoling before your baby will be able to breastfeed successfully. Do not  wait for the following signs of extreme hunger to occur before you initiate breastfeeding:   Restlessness.  A loud, strong cry.   Screaming. BREASTFEEDING BASICS Breastfeeding Initiation  Find a comfortable place to sit or lie down, with your neck and back well supported.  Place a pillow or rolled up blanket under your baby to bring him or her to the level of your breast (if you are seated). Nursing pillows are specially designed to help support your arms and your baby while you breastfeed.  Make sure that your baby's abdomen is facing your abdomen.   Gently massage your breast. With your fingertips, massage from your chest   wall toward your nipple in a circular motion. This encourages milk flow. You may need to continue this action during the feeding if your milk flows slowly.  Support your breast with 4 fingers underneath and your thumb above your nipple. Make sure your fingers are well away from your nipple and your baby's mouth.   Stroke your baby's lips gently with your finger or nipple.   When your baby's mouth is open wide enough, quickly bring your baby to your breast, placing your entire nipple and as much of the colored area around your nipple (areola) as possible into your baby's mouth.   More areola should be visible above your baby's upper lip than below the lower lip.   Your baby's tongue should be between his or her lower gum and your breast.   Ensure that your baby's mouth is correctly positioned around your nipple (latched). Your baby's lips should create a seal on your breast and be turned out (everted).  It is common for your baby to suck about 2-3 minutes in order to start the flow of breast milk. Latching Teaching your baby how to latch on to your breast properly is very important. An improper latch can cause nipple pain and decreased milk supply for you and poor weight gain in your baby. Also, if your baby is not latched onto your nipple properly, he or she  may swallow some air during feeding. This can make your baby fussy. Burping your baby when you switch breasts during the feeding can help to get rid of the air. However, teaching your baby to latch on properly is still the best way to prevent fussiness from swallowing air while breastfeeding. Signs that your baby has successfully latched on to your nipple:    Silent tugging or silent sucking, without causing you pain.   Swallowing heard between every 3-4 sucks.    Muscle movement above and in front of his or her ears while sucking.  Signs that your baby has not successfully latched on to nipple:   Sucking sounds or smacking sounds from your baby while breastfeeding.  Nipple pain. If you think your baby has not latched on correctly, slip your finger into the corner of your baby's mouth to break the suction and place it between your baby's gums. Attempt breastfeeding initiation again. Signs of Successful Breastfeeding Signs from your baby:   A gradual decrease in the number of sucks or complete cessation of sucking.   Falling asleep.   Relaxation of his or her body.   Retention of a small amount of milk in his or her mouth.   Letting go of your breast by himself or herself. Signs from you:  Breasts that have increased in firmness, weight, and size 1-3 hours after feeding.   Breasts that are softer immediately after breastfeeding.  Increased milk volume, as well as a change in milk consistency and color by the fifth day of breastfeeding.   Nipples that are not sore, cracked, or bleeding. Signs That Your Baby is Getting Enough Milk  Wetting at least 3 diapers in a 24-hour period. The urine should be clear and pale yellow by age 5 days.  At least 3 stools in a 24-hour period by age 5 days. The stool should be soft and yellow.  At least 3 stools in a 24-hour period by age 7 days. The stool should be seedy and yellow.  No loss of weight greater than 10% of birth weight  during the first 3   days of age.  Average weight gain of 4-7 ounces (113-198 g) per week after age 4 days.  Consistent daily weight gain by age 5 days, without weight loss after the age of 2 weeks. After a feeding, your baby may spit up a small amount. This is common. BREASTFEEDING FREQUENCY AND DURATION Frequent feeding will help you make more milk and can prevent sore nipples and breast engorgement. Breastfeed when you feel the need to reduce the fullness of your breasts or when your baby shows signs of hunger. This is called "breastfeeding on demand." Avoid introducing a pacifier to your baby while you are working to establish breastfeeding (the first 4-6 weeks after your baby is born). After this time you may choose to use a pacifier. Research has shown that pacifier use during the first year of a baby's life decreases the risk of sudden infant death syndrome (SIDS). Allow your baby to feed on each breast as long as he or she wants. Breastfeed until your baby is finished feeding. When your baby unlatches or falls asleep while feeding from the first breast, offer the second breast. Because newborns are often sleepy in the first few weeks of life, you may need to awaken your baby to get him or her to feed. Breastfeeding times will vary from baby to baby. However, the following rules can serve as a guide to help you ensure that your baby is properly fed:  Newborns (babies 4 weeks of age or younger) may breastfeed every 1-3 hours.  Newborns should not go longer than 3 hours during the day or 5 hours during the night without breastfeeding.  You should breastfeed your baby a minimum of 8 times in a 24-hour period until you begin to introduce solid foods to your baby at around 6 months of age. BREAST MILK PUMPING Pumping and storing breast milk allows you to ensure that your baby is exclusively fed your breast milk, even at times when you are unable to breastfeed. This is especially important if you are  going back to work while you are still breastfeeding or when you are not able to be present during feedings. Your lactation consultant can give you guidelines on how long it is safe to store breast milk.  A breast pump is a machine that allows you to pump milk from your breast into a sterile bottle. The pumped breast milk can then be stored in a refrigerator or freezer. Some breast pumps are operated by hand, while others use electricity. Ask your lactation consultant which type will work best for you. Breast pumps can be purchased, but some hospitals and breastfeeding support groups lease breast pumps on a monthly basis. A lactation consultant can teach you how to hand express breast milk, if you prefer not to use a pump.  CARING FOR YOUR BREASTS WHILE YOU BREASTFEED Nipples can become dry, cracked, and sore while breastfeeding. The following recommendations can help keep your breasts moisturized and healthy:  Avoid using soap on your nipples.   Wear a supportive bra. Although not required, special nursing bras and tank tops are designed to allow access to your breasts for breastfeeding without taking off your entire bra or top. Avoid wearing underwire-style bras or extremely tight bras.  Air dry your nipples for 3-4minutes after each feeding.   Use only cotton bra pads to absorb leaked breast milk. Leaking of breast milk between feedings is normal.   Use lanolin on your nipples after breastfeeding. Lanolin helps to maintain your skin's   normal moisture barrier. If you use pure lanolin, you do not need to wash it off before feeding your baby again. Pure lanolin is not toxic to your baby. You may also hand express a few drops of breast milk and gently massage that milk into your nipples and allow the milk to air dry. In the first few weeks after giving birth, some women experience extremely full breasts (engorgement). Engorgement can make your breasts feel heavy, warm, and tender to the touch.  Engorgement peaks within 3-5 days after you give birth. The following recommendations can help ease engorgement:  Completely empty your breasts while breastfeeding or pumping. You may want to start by applying warm, moist heat (in the shower or with warm water-soaked hand towels) just before feeding or pumping. This increases circulation and helps the milk flow. If your baby does not completely empty your breasts while breastfeeding, pump any extra milk after he or she is finished.  Wear a snug bra (nursing or regular) or tank top for 1-2 days to signal your body to slightly decrease milk production.  Apply ice packs to your breasts, unless this is too uncomfortable for you.  Make sure that your baby is latched on and positioned properly while breastfeeding. If engorgement persists after 48 hours of following these recommendations, contact your health care provider or a lactation consultant. OVERALL HEALTH CARE RECOMMENDATIONS WHILE BREASTFEEDING  Eat healthy foods. Alternate between meals and snacks, eating 3 of each per day. Because what you eat affects your breast milk, some of the foods may make your baby more irritable than usual. Avoid eating these foods if you are sure that they are negatively affecting your baby.  Drink milk, fruit juice, and water to satisfy your thirst (about 10 glasses a day).   Rest often, relax, and continue to take your prenatal vitamins to prevent fatigue, stress, and anemia.  Continue breast self-awareness checks.  Avoid chewing and smoking tobacco.  Avoid alcohol and drug use. Some medicines that may be harmful to your baby can pass through breast milk. It is important to ask your health care provider before taking any medicine, including all over-the-counter and prescription medicine as well as vitamin and herbal supplements. It is possible to become pregnant while breastfeeding. If birth control is desired, ask your health care provider about options that  will be safe for your baby. SEEK MEDICAL CARE IF:   You feel like you want to stop breastfeeding or have become frustrated with breastfeeding.  You have painful breasts or nipples.  Your nipples are cracked or bleeding.  Your breasts are red, tender, or warm.  You have a swollen area on either breast.  You have a fever or chills.  You have nausea or vomiting.  You have drainage other than breast milk from your nipples.  Your breasts do not become full before feedings by the fifth day after you give birth.  You feel sad and depressed.  Your baby is too sleepy to eat well.  Your baby is having trouble sleeping.   Your baby is wetting less than 3 diapers in a 24-hour period.  Your baby has less than 3 stools in a 24-hour period.  Your baby's skin or the white part of his or her eyes becomes yellow.   Your baby is not gaining weight by 5 days of age. SEEK IMMEDIATE MEDICAL CARE IF:   Your baby is overly tired (lethargic) and does not want to wake up and feed.  Your baby   develops an unexplained fever. Document Released: 06/18/2005 Document Revised: 06/23/2013 Document Reviewed: 12/10/2012 [employer] Patient Information 2015 [employer]. This information is not intended to replace advice given to you by your health care provider. Make sure you discuss any questions you have with your health care provider.  

## 2014-10-10 ENCOUNTER — Inpatient Hospital Stay (HOSPITAL_COMMUNITY)
Admission: AD | Admit: 2014-10-10 | Discharge: 2014-10-10 | Disposition: A | Discharge status: Home / Self Care | Payer: Medicaid Other | Source: Ambulatory Visit | Attending: Family Medicine | Admitting: Family Medicine

## 2014-10-10 ENCOUNTER — Encounter (HOSPITAL_COMMUNITY): Payer: Self-pay

## 2014-10-10 DIAGNOSIS — O9989 Other specified diseases and conditions complicating pregnancy, childbirth and the puerperium: Secondary | ICD-10-CM | POA: Diagnosis not present

## 2014-10-10 DIAGNOSIS — Z3A39 39 weeks gestation of pregnancy: Secondary | ICD-10-CM | POA: Insufficient documentation

## 2014-10-10 DIAGNOSIS — N898 Other specified noninflammatory disorders of vagina: Secondary | ICD-10-CM | POA: Insufficient documentation

## 2014-10-10 LAB — AMNISURE RUPTURE OF MEMBRANE (ROM) NOT AT ARMC: AMNISURE: NEGATIVE

## 2014-10-10 NOTE — [facility] Note (Signed)
Pt states beginning yesterday around 1000 noticed vaginal discharge that was watery. Has had intermittent episodes of leaking of fluid. No large gush. No pain.

## 2014-10-10 NOTE — Discharge Instructions (Signed)
Third Trimester of Pregnancy The third trimester is from week 29 through week 42, months 7 through 9. This trimester is when your unborn baby (fetus) is growing very fast. At the end of the ninth month, the unborn baby is about 20 inches in length. It weighs about 6-10 pounds.  HOME CARE   Avoid all smoking, herbs, and alcohol. Avoid drugs not approved by your doctor.  Only take medicine as told by your doctor. Some medicines are safe and some are not during pregnancy.  Exercise only as told by your doctor. Stop exercising if you start having cramps.  Eat regular, healthy meals.  Wear a good support bra if your breasts are tender.  Do not use hot tubs, steam rooms, or saunas.  Wear your seat belt when driving.  Avoid raw meat, uncooked cheese, and liter boxes and soil used by cats.  Take your prenatal vitamins.  Try taking medicine that helps you poop (stool softener) as needed, and if your doctor approves. Eat more fiber by eating fresh fruit, vegetables, and whole grains. Drink enough fluids to keep your pee (urine) clear or pale yellow.  Take warm water baths (sitz baths) to soothe pain or discomfort caused by hemorrhoids. Use hemorrhoid cream if your doctor approves.  If you have puffy, bulging veins (varicose veins), wear support hose. Raise (elevate) your feet for 15 minutes, 3-4 times a day. Limit salt in your diet.  Avoid heavy lifting, wear low heels, and sit up straight.  Rest with your legs raised if you have leg cramps or low back pain.  Visit your dentist if you have not gone during your pregnancy. Use a soft toothbrush to brush your teeth. Be gentle when you floss.  You can have sex (intercourse) unless your doctor tells you not to.  Do not travel far distances unless you must. Only do so with your doctor's approval.  Take prenatal classes.  Practice driving to the hospital.  Pack your hospital bag.  Prepare the baby's room.  Go to your doctor visits. GET  HELP IF:  You are not sure if you are in labor or if your water has broken.  You are dizzy.  You have mild cramps or pressure in your lower belly (abdominal).  You have a nagging pain in your belly area.  You continue to feel sick to your stomach (nauseous), throw up (vomit), or have watery poop (diarrhea).  You have bad smelling fluid coming from your vagina.  You have pain with peeing (urination). GET HELP RIGHT AWAY IF:   You have a fever.  You are leaking fluid from your vagina.  You are spotting or bleeding from your vagina.  You have severe belly cramping or pain.  You lose or gain weight rapidly.  You have trouble catching your breath and have chest pain.  You notice sudden or extreme puffiness (swelling) of your face, hands, ankles, feet, or legs.  You have not felt the baby move in over an hour.  You have severe headaches that do not go away with medicine.  You have vision changes. Document Released: 09/12/2009 Document Revised: 10/13/2012 Document Reviewed: 08/19/2012 Exit[employer] Patient Information 2015 [employer]. This information is not intended to replace advice given to you by your health care provider. Make sure you discuss any questions you have with your health care provider.  

## 2014-10-14 ENCOUNTER — Encounter: Payer: Self-pay | Admitting: Obstetrics and Gynecology

## 2014-10-14 ENCOUNTER — Ambulatory Visit (INDEPENDENT_AMBULATORY_CARE_PROVIDER_SITE_OTHER): Payer: Medicaid Other | Admitting: Obstetrics and Gynecology

## 2014-10-14 VITALS — BP 110/75 | HR 97

## 2014-10-14 DIAGNOSIS — O09523 Supervision of elderly multigravida, third trimester: Secondary | ICD-10-CM

## 2014-10-14 DIAGNOSIS — N390 Urinary tract infection, site not specified: Secondary | ICD-10-CM

## 2014-10-14 DIAGNOSIS — R938 Abnormal findings on diagnostic imaging of other specified body structures: Secondary | ICD-10-CM

## 2014-10-14 DIAGNOSIS — B951 Streptococcus, group B, as the cause of diseases classified elsewhere: Secondary | ICD-10-CM

## 2014-10-14 DIAGNOSIS — Z3483 Encounter for supervision of other normal pregnancy, third trimester: Secondary | ICD-10-CM

## 2014-10-14 DIAGNOSIS — R8271 Bacteriuria: Secondary | ICD-10-CM

## 2014-10-14 DIAGNOSIS — O283 Abnormal ultrasonic finding on antenatal screening of mother: Secondary | ICD-10-CM

## 2014-10-14 NOTE — Progress Notes (Signed)
Patient is doing well without complaints. FM/labor precautions reviewed. Membrane stripped

## 2014-10-16 ENCOUNTER — Inpatient Hospital Stay (HOSPITAL_COMMUNITY)
Admission: AD | Admit: 2014-10-16 | Discharge: 2014-10-18 | DRG: 767 | Disposition: A | Discharge status: Home / Self Care | Payer: Medicaid Other | Source: Ambulatory Visit | Attending: Obstetrics & Gynecology | Admitting: Obstetrics & Gynecology

## 2014-10-16 ENCOUNTER — Encounter (HOSPITAL_COMMUNITY): Payer: Self-pay | Admitting: *Deleted

## 2014-10-16 DIAGNOSIS — O09523 Supervision of elderly multigravida, third trimester: Secondary | ICD-10-CM

## 2014-10-16 DIAGNOSIS — O99824 Streptococcus B carrier state complicating childbirth: Secondary | ICD-10-CM | POA: Diagnosis present

## 2014-10-16 DIAGNOSIS — O99820 Streptococcus B carrier state complicating pregnancy: Secondary | ICD-10-CM

## 2014-10-16 DIAGNOSIS — R8271 Bacteriuria: Secondary | ICD-10-CM

## 2014-10-16 DIAGNOSIS — Z302 Encounter for sterilization: Secondary | ICD-10-CM

## 2014-10-16 DIAGNOSIS — O283 Abnormal ultrasonic finding on antenatal screening of mother: Secondary | ICD-10-CM

## 2014-10-16 DIAGNOSIS — Z3A39 39 weeks gestation of pregnancy: Secondary | ICD-10-CM | POA: Diagnosis present

## 2014-10-16 DIAGNOSIS — IMO0001 Reserved for inherently not codable concepts without codable children: Secondary | ICD-10-CM

## 2014-10-16 LAB — CBC
HEMATOCRIT: 33.1 % — AB (ref 36.0–46.0)
Hemoglobin: 10.6 g/dL — ABNORMAL LOW (ref 12.0–15.0)
MCH: 26.4 pg (ref 26.0–34.0)
MCHC: 32 g/dL (ref 30.0–36.0)
MCV: 82.3 fL (ref 78.0–100.0)
Platelets: 202 10*3/uL (ref 150–400)
RBC: 4.02 MIL/uL (ref 3.87–5.11)
RDW: 15.7 % — AB (ref 11.5–15.5)
WBC: 9.8 10*3/uL (ref 4.0–10.5)

## 2014-10-16 LAB — TYPE AND SCREEN
ABO/RH(D): O POS
Antibody Screen: NEGATIVE

## 2014-10-16 LAB — OB RESULTS CONSOLE GBS: GBS: POSITIVE

## 2014-10-16 MED ORDER — OXYCODONE-ACETAMINOPHEN 5-325 MG PO TABS: 2.0000 | ORAL_TABLET | ORAL | Status: DC | PRN

## 2014-10-16 MED ORDER — LACTATED RINGERS IV SOLN
INTRAVENOUS | Status: DC
  Administered 2014-10-16: 18:00:00 via INTRAVENOUS

## 2014-10-16 MED ORDER — OXYTOCIN BOLUS FROM INFUSION
500.0000 mL | INTRAVENOUS | Status: DC
  Administered 2014-10-17: 500 mL via INTRAVENOUS

## 2014-10-16 MED ORDER — FENTANYL CITRATE (PF) 100 MCG/2ML IJ SOLN
100.0000 ug | INTRAMUSCULAR | Status: DC | PRN
  Administered 2014-10-16 (×3): 100 ug via INTRAVENOUS
  Filled 2014-10-16 (×3): qty 2

## 2014-10-16 MED ORDER — ONDANSETRON HCL 4 MG/2ML IJ SOLN: 4.0000 mg | Freq: Four times a day (QID) | INTRAMUSCULAR | Status: DC | PRN

## 2014-10-16 MED ORDER — ACETAMINOPHEN 325 MG PO TABS: 650.0000 mg | ORAL_TABLET | ORAL | Status: DC | PRN

## 2014-10-16 MED ORDER — CEFAZOLIN SODIUM-DEXTROSE 2-3 GM-% IV SOLR
2.0000 g | Freq: Three times a day (TID) | INTRAVENOUS | Status: DC
  Administered 2014-10-16: 2 g via INTRAVENOUS
  Filled 2014-10-16 (×3): qty 50

## 2014-10-16 MED ORDER — VANCOMYCIN HCL IN DEXTROSE 1-5 GM/200ML-% IV SOLN: 1000.0000 mg | Freq: Two times a day (BID) | INTRAVENOUS | Status: DC

## 2014-10-16 MED ORDER — OXYTOCIN 40 UNITS IN LACTATED RINGERS INFUSION - SIMPLE MED
62.5000 mL/h | INTRAVENOUS | Status: DC
  Filled 2014-10-16: qty 1000

## 2014-10-16 MED ORDER — LIDOCAINE HCL (PF) 1 % IJ SOLN
30.0000 mL | INTRAMUSCULAR | Status: DC | PRN
  Filled 2014-10-16: qty 30

## 2014-10-16 MED ORDER — OXYCODONE-ACETAMINOPHEN 5-325 MG PO TABS: 1.0000 | ORAL_TABLET | ORAL | Status: DC | PRN

## 2014-10-16 MED ORDER — CITRIC ACID-SODIUM CITRATE 334-500 MG/5ML PO SOLN: 30.0000 mL | ORAL | Status: DC | PRN

## 2014-10-16 MED ORDER — LACTATED RINGERS IV SOLN: 500.0000 mL | INTRAVENOUS | Status: DC | PRN

## 2014-10-16 NOTE — Progress Notes (Signed)
Kathy Sharp is a [age] y.o. G3P2002 at [id number] by LMP admitted for active labor  Subjective: Patient reports starting to feel more pressure.  She reports contractions feeling more intense.  Objective: BP 127/71 mmHg  Pulse 96  Temp(Src) 99.2 F (37.3 C) (Oral)  Resp 20  Ht 5\' 5"  (1.651 m)  Wt 146 lb (66.225 kg)  BMI 24.30 kg/m2  LMP 01/13/2014 (Exact Date)     FHT:  FHR: 135 bpm, variability: moderate,  accelerations:  Present,  decelerations:  Absent UC:   regular, every 1-4 minutes SVE:   Dilation: 7.5 Effacement (%): 90 Station: -1 Exam by:: Lilli Few. Blackstock, RN  Labs: Lab Results  Component Value Date   WBC 9.8 10/16/2014   HGB 10.6* 10/16/2014   HCT 33.1* 10/16/2014   MCV 82.3 10/16/2014   PLT 202 10/16/2014    Assessment / Plan: Spontaneous labor, progressing normally  Labor: Progressing normally Fetal Wellbeing:  Category I Pain Control:  Fentanyl I/D:  GBS+, Ancef Anticipated MOD:  NSVD  Raliegh IpGottschalk, Myrlene Riera M, DO 10/16/2014, 10:50 PM

## 2014-10-16 NOTE — H&P (Signed)
LABOR ADMISSION HISTORY AND PHYSICAL  Kathy Sharp is a 39 y.o. female G3P2002 with IUP at [redacted]w[redacted]d by LMP presenting for spontaneous onset labor. Uterine contractions started this morning around 0800 and have been increasing in frequency. Contractions were 7-9 minutes when she arrived at [facility]U. She reports +FMs, No LOF, no VB, no blurry vision, headaches or peripheral edema, and RUQ pain.  She plans on breast and bottle feeding. She request BTL for birth control.  Dating: By LMP --->  Estimated Date of Delivery: 10/20/14  Sono:    [redacted]w[redacted]d, CWD, normal anatomy, variable cephalic to breech presentation, 424g, 51%ile  EFW   Prenatal History/Complications:  Clinic  [facility]  Dating  LMP  Genetic Screen  declined  Anatomic [place] Intracardiac focus, otherwise normal  GTT  Third trimester: 66  TDaP vaccine  2-16  Flu vaccine declined  GBS Positive in urine  Contraception BTL  Baby Food  breast  Circumcision  yes  Pediatrician   Support Person FOB, sister    Past Medical History: Past Medical History  Diagnosis Date  . Anemia   . Bacterial vaginosis     Past Surgical History: History reviewed. No pertinent past surgical history.  Obstetrical History: OB History    Gravida Para Term Preterm AB TAB SAB Ectopic Multiple Living   Social History: History   Social History  . Marital Status: Single    Spouse Name: N/A  . Number of Children: N/A  . Years of Education: N/A   Social History Main Topics  . Smoking status: Never Smoker   . Smokeless tobacco: Never Used  . Alcohol Use: No     Comment: OCC  . Drug Use: No  . Sexual Activity: Yes    Birth Control/ Protection: None   Other Topics Concern  . None   Social History Narrative    Family History: Family History  Problem Relation Age of Onset  . Cancer Mother     Allergies: Allergies  Allergen Reactions  . Penicillins Anaphylaxis, Hives and Other (See Comments)    Pt has taken cephalosporins  without any problem.   . Latex Other (See Comments)    Pt states she had Irritation in vagina from gloves after exam.    Prescriptions prior to admission  Medication Sig Dispense Refill Last Dose  . Prenatal Vit-Fe Fumarate-FA (PRENATAL MULTIVITAMIN) TABS tablet Take 1 tablet by mouth daily.   two weeks  . EPINEPHrine (EPI-PEN) 0.3 mg/0.3 mL DEVI Inject 0.3 mLs (0.3 mg total) into the muscle once. In event of severe allergic reaction. 1 Device 0 rescue     Review of Systems   All systems reviewed and negative except as stated in HPI  Blood pressure 135/70, pulse 94, temperature 97.8 F (36.6 C), temperature source Oral, resp. rate 18, height  (1.651 m), weight 66.225 kg (146 lb), last menstrual period 01/13/2014. General appearance: alert and cooperative Lungs: clear to auscultation bilaterally Heart: regular rate and rhythm Abdomen: soft, non-tender; bowel sounds normal Extremities: Homans sign is negative, no sign of DVT DTR's 2+ Presentation: cephalic Fetal monitoringBaseline: 140 bpm, Variability: Good {> 6 bpm), Accelerations: Reactive and Decelerations: Absent Uterine activityDate/time of onset: 04/16 0800 and Frequency: 7-9 minutes Dilation: 4.5 Effacement (%): 70 Station: -2 Exam by:: B Mosca RN   Prenatal labs: ABO, Rh: O/POS/-- (11/25 1506) Antibody: NEG (11/25 1506) Rubella:   RPR: NON REAC (02/09 1107)  HBsAg: NEGATIVE (11/25 1506)  HIV: NONREACTIVE (02/09 1107)  GBS:   POSITIVE 1 hr Glucola normal (94) Genetic screening  Declined  Anatomy US normal female   Prenatal Transfer Tool  Maternal Diabetes: No Genetic Screening: Declined Maternal Ultrasounds/Referrals: Abnormal:  Findings:   Isolated EIF (echogenic intracardiac focus) Fetal Ultrasounds or other Referrals:  None Maternal Substance Abuse:  No Significant Maternal Medications:  None Significant Maternal Lab Results: Lab values include: Group B Strep positive  Results for orders placed or  performed during the hospital encounter of 10/16/14 (from the past 24 hour(s))  CBC   Collection Time: 10/16/14  5:40 PM  Result Value Ref Range   WBC 9.8 4.0 - 10.5 K/uL   RBC 4.02 3.87 - 5.11 MIL/uL   Hemoglobin 10.6 (L) 12.0 - 15.0 g/dL   HCT 13.033.1 (L) 86.536.0 - 78.446.0 %   MCV 82.3 78.0 - 100.0 fL   MCH 26.4 26.0 - 34.0 pg   MCHC 32.0 30.0 - 36.0 g/dL   RDW 69.615.7 (H) 29.511.5 - 28.415.5 %   Platelets 202 150 - 400 K/uL    Patient Active Problem List   Diagnosis Date Noted  . Active labor 10/16/2014  . Group B streptococcal bacteriuria 09/27/2014  . Abnormal ultrasonic finding on antenatal screening of mother 07/09/2014  . Supervision of other normal pregnancy 05/26/2014  . AMA (advanced maternal age) multigravida 35+ 05/26/2014    Assessment: Kathy Sharp is a 39 y.o. G3P2002 at 50105w3d here for spontaneous onset of labor.   #Labor:expectant management  #Pain: Desires IV pain medication, no epidural  #FWB: Cat I  #ID: GBS POSITIVE  #MOF: breast/bottle feed #MOC:BTL  #Circ: outpatient circ   Kathy Sharp 10/16/2014, 6:00 PM    OB fellow attestation:  I have seen and examined this patient; I agree with above documentation in the medical student's note.   Kathy Sharp is a 39 y.o. G3P2002 here for SOL  PE: BP 112/76 mmHg  Pulse 91  Temp(Src) 98 F (36.7 C) (Oral)  Resp 18  Ht 5\' 5"  (1.651 m)  Wt 146 lb (66.225 kg)  BMI 24.30 kg/m2  LMP 01/13/2014 (Exact Date) Gen: calm comfortable, NAD Resp: normal effort, no distress Abd: gravid  ROS, labs, PMH reviewed  Plan: MOF: breast and bottle MOC: BTL ID: GBS+ => ancef FWB: cat I Labor: AROM at next check Circ: OP circ Pain: declines epidural, fentanyl ordered prn  Kathy Sharp ROCIO 10/16/2014, 6:48 PM

## 2014-10-16 NOTE — MAU Note (Signed)
Pt reports UC's since 8am. Uc's are now about 7-9 minutes apart. Pt reports some brown bloody discharge. +FM

## 2014-10-16 NOTE — Progress Notes (Signed)
Labor Progress Note  S: uncomfortable with contractions but reports still being able to tolerate them  O:  BP 126/87 mmHg  Pulse 93  Temp(Src) 99.2 F (37.3 C) (Oral)  Resp 20  Ht 5\' 5"  (1.651 m)  Wt 146 lb (66.225 kg)  BMI 24.30 kg/m2  LMP 01/13/2014 (Exact Date) Cat I CVE: 7/80/-1  A&P: 39 y.o. G3P2002 2261w3d SOL # labor: expectant mgmt, good change despite inadequate contraction pattern  Kathy Bures ROCIO, MD 9:22 PM

## 2014-10-17 ENCOUNTER — Encounter (HOSPITAL_COMMUNITY): Payer: Self-pay

## 2014-10-17 ENCOUNTER — Inpatient Hospital Stay (HOSPITAL_COMMUNITY): Payer: Medicaid Other | Admitting: Anesthesiology

## 2014-10-17 ENCOUNTER — Encounter (HOSPITAL_COMMUNITY)
Admission: AD | Disposition: A | Discharge status: Home / Self Care | Payer: Self-pay | Source: Ambulatory Visit | Attending: Obstetrics & Gynecology

## 2014-10-17 DIAGNOSIS — Z302 Encounter for sterilization: Secondary | ICD-10-CM

## 2014-10-17 HISTORY — PX: TUBAL LIGATION: SHX77

## 2014-10-17 LAB — SURGICAL PCR SCREEN
MRSA, PCR: NEGATIVE
Staphylococcus aureus: NEGATIVE

## 2014-10-17 LAB — RPR: RPR Ser Ql: NONREACTIVE

## 2014-10-17 LAB — HIV ANTIBODY (ROUTINE TESTING W REFLEX): HIV SCREEN 4TH GENERATION: NONREACTIVE

## 2014-10-17 SURGERY — LIGATION, FALLOPIAN TUBE, POSTPARTUM
Anesthesia: Spinal | Site: Abdomen | Laterality: Bilateral

## 2014-10-17 MED ORDER — OXYCODONE-ACETAMINOPHEN 5-325 MG PO TABS: 2.0000 | ORAL_TABLET | ORAL | Status: DC | PRN

## 2014-10-17 MED ORDER — BUPIVACAINE HCL (PF) 0.25 % IJ SOLN
INTRAMUSCULAR | Status: AC
  Filled 2014-10-17: qty 30

## 2014-10-17 MED ORDER — ZOLPIDEM TARTRATE 5 MG PO TABS: 5.0000 mg | ORAL_TABLET | Freq: Every evening | ORAL | Status: DC | PRN

## 2014-10-17 MED ORDER — FENTANYL CITRATE (PF) 100 MCG/2ML IJ SOLN
INTRAMUSCULAR | Status: AC
  Filled 2014-10-17: qty 2

## 2014-10-17 MED ORDER — TETANUS-DIPHTH-ACELL PERTUSSIS 5-2.5-18.5 LF-MCG/0.5 IM SUSP: 0.5000 mL | Freq: Once | INTRAMUSCULAR | Status: DC

## 2014-10-17 MED ORDER — MEPERIDINE HCL 25 MG/ML IJ SOLN: 6.2500 mg | INTRAMUSCULAR | Status: DC | PRN

## 2014-10-17 MED ORDER — DIPHENHYDRAMINE HCL 25 MG PO CAPS: 25.0000 mg | ORAL_CAPSULE | Freq: Four times a day (QID) | ORAL | Status: DC | PRN

## 2014-10-17 MED ORDER — BUPIVACAINE IN DEXTROSE 0.75-8.25 % IT SOLN
INTRATHECAL | Status: DC | PRN
  Administered 2014-10-17: 1.4 mL via INTRATHECAL

## 2014-10-17 MED ORDER — BUPIVACAINE HCL 0.25 % IJ SOLN
INTRAMUSCULAR | Status: DC | PRN
  Administered 2014-10-17: 14 mL

## 2014-10-17 MED ORDER — MIDAZOLAM HCL 5 MG/5ML IJ SOLN
INTRAMUSCULAR | Status: DC | PRN
  Administered 2014-10-17 (×2): 1 mg via INTRAVENOUS

## 2014-10-17 MED ORDER — IBUPROFEN 600 MG PO TABS
600.0000 mg | ORAL_TABLET | Freq: Four times a day (QID) | ORAL | Status: DC
  Administered 2014-10-17 – 2014-10-18 (×6): 600 mg via ORAL
  Filled 2014-10-17 (×6): qty 1

## 2014-10-17 MED ORDER — HYDROMORPHONE HCL 1 MG/ML IJ SOLN: 0.5000 mg | INTRAMUSCULAR | Status: DC | PRN

## 2014-10-17 MED ORDER — LIDOCAINE HCL (CARDIAC) 20 MG/ML IV SOLN
INTRAVENOUS | Status: AC
  Filled 2014-10-17: qty 5

## 2014-10-17 MED ORDER — FAMOTIDINE 20 MG PO TABS
40.0000 mg | ORAL_TABLET | Freq: Once | ORAL | Status: AC
  Administered 2014-10-17: 40 mg via ORAL
  Filled 2014-10-17: qty 2

## 2014-10-17 MED ORDER — FENTANYL CITRATE (PF) 100 MCG/2ML IJ SOLN: 25.0000 ug | INTRAMUSCULAR | Status: DC | PRN

## 2014-10-17 MED ORDER — LANOLIN HYDROUS EX OINT: TOPICAL_OINTMENT | CUTANEOUS | Status: DC | PRN

## 2014-10-17 MED ORDER — PROPOFOL 10 MG/ML IV BOLUS
INTRAVENOUS | Status: AC
  Filled 2014-10-17: qty 20

## 2014-10-17 MED ORDER — PROMETHAZINE HCL 25 MG/ML IJ SOLN: 6.2500 mg | INTRAMUSCULAR | Status: DC | PRN

## 2014-10-17 MED ORDER — WITCH HAZEL-GLYCERIN EX PADS: 1.0000 "application " | MEDICATED_PAD | CUTANEOUS | Status: DC | PRN

## 2014-10-17 MED ORDER — FENTANYL CITRATE (PF) 100 MCG/2ML IJ SOLN
INTRAMUSCULAR | Status: DC | PRN
  Administered 2014-10-17 (×2): 50 ug via INTRAVENOUS

## 2014-10-17 MED ORDER — ONDANSETRON HCL 4 MG/2ML IJ SOLN
INTRAMUSCULAR | Status: DC | PRN
  Administered 2014-10-17: 4 mg via INTRAVENOUS

## 2014-10-17 MED ORDER — MIDAZOLAM HCL 2 MG/2ML IJ SOLN
INTRAMUSCULAR | Status: AC
  Filled 2014-10-17: qty 2

## 2014-10-17 MED ORDER — EPINEPHRINE 0.3 MG/0.3ML IJ DEVI: 0.3000 mg | Freq: Once | INTRAMUSCULAR | Status: DC

## 2014-10-17 MED ORDER — OXYCODONE-ACETAMINOPHEN 5-325 MG PO TABS
1.0000 | ORAL_TABLET | ORAL | Status: DC | PRN
  Administered 2014-10-17 – 2014-10-18 (×3): 1 via ORAL
  Filled 2014-10-17 (×3): qty 1

## 2014-10-17 MED ORDER — PROPOFOL 10 MG/ML IV BOLUS
INTRAVENOUS | Status: DC | PRN
  Administered 2014-10-17: 10 mg via INTRAVENOUS
  Administered 2014-10-17 (×2): 5 mg via INTRAVENOUS

## 2014-10-17 MED ORDER — LIDOCAINE HCL (CARDIAC) 20 MG/ML IV SOLN
INTRAVENOUS | Status: DC | PRN
  Administered 2014-10-17: 10 mg via INTRAVENOUS

## 2014-10-17 MED ORDER — SIMETHICONE 80 MG PO CHEW: 80.0000 mg | CHEWABLE_TABLET | ORAL | Status: DC | PRN

## 2014-10-17 MED ORDER — METOCLOPRAMIDE HCL 10 MG PO TABS
10.0000 mg | ORAL_TABLET | Freq: Once | ORAL | Status: AC
  Administered 2014-10-17: 10 mg via ORAL
  Filled 2014-10-17: qty 1

## 2014-10-17 MED ORDER — LACTATED RINGERS IV SOLN
INTRAVENOUS | Status: DC
Start: 2014-10-17 — End: 2014-10-18
  Administered 2014-10-17 (×3): via INTRAVENOUS

## 2014-10-17 MED ORDER — PRENATAL MULTIVITAMIN CH
1.0000 | ORAL_TABLET | Freq: Every day | ORAL | Status: DC
  Administered 2014-10-17 – 2014-10-18 (×2): 1 via ORAL
  Filled 2014-10-17 (×2): qty 1

## 2014-10-17 MED ORDER — ACETAMINOPHEN 325 MG PO TABS: 650.0000 mg | ORAL_TABLET | ORAL | Status: DC | PRN

## 2014-10-17 MED ORDER — ONDANSETRON HCL 4 MG PO TABS: 4.0000 mg | ORAL_TABLET | ORAL | Status: DC | PRN

## 2014-10-17 MED ORDER — ONDANSETRON HCL 4 MG/2ML IJ SOLN
INTRAMUSCULAR | Status: AC
  Filled 2014-10-17: qty 2

## 2014-10-17 MED ORDER — LIDOCAINE-EPINEPHRINE 2 %-1:100000 IJ SOLN: INTRAMUSCULAR | Status: DC | PRN

## 2014-10-17 MED ORDER — ONDANSETRON HCL 4 MG/2ML IJ SOLN: 4.0000 mg | INTRAMUSCULAR | Status: DC | PRN

## 2014-10-17 MED ORDER — BENZOCAINE-MENTHOL 20-0.5 % EX AERO: 1.0000 "application " | INHALATION_SPRAY | CUTANEOUS | Status: DC | PRN

## 2014-10-17 MED ORDER — SENNOSIDES-DOCUSATE SODIUM 8.6-50 MG PO TABS
2.0000 | ORAL_TABLET | ORAL | Status: DC
  Administered 2014-10-18: 2 via ORAL
  Filled 2014-10-17: qty 2

## 2014-10-17 MED ORDER — DIBUCAINE 1 % RE OINT: 1.0000 "application " | TOPICAL_OINTMENT | RECTAL | Status: DC | PRN

## 2014-10-17 SURGICAL SUPPLY — 28 items
BENZOIN TINCTURE PRP APPL 2/3 (GAUZE/BANDAGES/DRESSINGS) ×3 IMPLANT
CLIP FILSHIE TUBAL LIGA STRL (Clip) ×3 IMPLANT
CLOSURE WOUND 1/4X4 (GAUZE/BANDAGES/DRESSINGS) ×1
CLOTH BEACON ORANGE TIMEOUT ST (SAFETY) ×3 IMPLANT
CONTAINER PREFILL 10% NBF 15ML (MISCELLANEOUS) IMPLANT
DRESSING OPSITE X SMALL 2X3 (GAUZE/BANDAGES/DRESSINGS) ×3 IMPLANT
DRSG OPSITE POSTOP 3X4 (GAUZE/BANDAGES/DRESSINGS) ×3 IMPLANT
GLOVE BIOGEL PI IND STRL 7.0 (GLOVE) ×3 IMPLANT
GLOVE BIOGEL PI IND STRL 9 (GLOVE) ×2 IMPLANT
GLOVE BIOGEL PI INDICATOR 7.0 (GLOVE) ×6
GLOVE BIOGEL PI INDICATOR 9 (GLOVE) ×4
GLOVE ECLIPSE 9.0 STRL (GLOVE) IMPLANT
GLOVE SS PI 9.0 STRL (GLOVE) ×3 IMPLANT
GLOVE SURG SS PI 7.0 STRL IVOR (GLOVE) ×6 IMPLANT
GOWN STRL REUS W/TWL 2XL LVL3 (GOWN DISPOSABLE) ×6 IMPLANT
GOWN STRL REUS W/TWL LRG LVL3 (GOWN DISPOSABLE) ×3 IMPLANT
NS IRRIG 1000ML POUR BTL (IV SOLUTION) ×3 IMPLANT
PACK ABDOMINAL MINOR (CUSTOM PROCEDURE TRAY) ×3 IMPLANT
SPONGE LAP 4X18 X RAY DECT (DISPOSABLE) IMPLANT
STRIP CLOSURE SKIN 1/4X4 (GAUZE/BANDAGES/DRESSINGS) ×2 IMPLANT
SUT VIC AB 0 CT1 27 (SUTURE)
SUT VIC AB 0 CT1 27XBRD ANBCTR (SUTURE) IMPLANT
SUT VIC AB 4-0 PS2 27 (SUTURE) ×3 IMPLANT
SYR BULB IRRIGATION 50ML (SYRINGE) ×3 IMPLANT
TOWEL OR 17X24 6PK STRL BLUE (TOWEL DISPOSABLE) ×6 IMPLANT
TRAY FOLEY BAG SILVER LF 16FR (CATHETERS) ×3 IMPLANT
TRAY FOLEY CATH SILVER 14FR (SET/KITS/TRAYS/PACK) IMPLANT
WATER STERILE IRR 1000ML POUR (IV SOLUTION) ×3 IMPLANT

## 2014-10-17 NOTE — Op Note (Signed)
10/16/2014 - 10/17/2014  10:05 AM  PATIENT:  Kathy Sharp  38 y.o. female  PRE-OPERATIVE DIAGNOSIS:  desired sterilization  POST-OPERATIVE DIAGNOSIS:  desired sterilization, performed  PROCEDURE:  Procedure(s): POST PARTUM TUBAL LIGATION (Bilateral) Filshie Clip placement SURGEON:  Surgeon(s) and Role:    * Jareth Pardee V, MD - Primary  PHYSICIAN ASSISTANT:   ASSISTANTS: none   ANESTHESIA:   spinal  EBL:  Total I/O In: 1000 [I.V.:1000] Out: 5 [Blood:5]  BLOOD ADMINISTERED:none  DRAINS: Urinary Catheter (Foley)  D/c in RR  LOCAL MEDICATIONS USED:  MARCAINE    and Amount: 14 ml  SPECIMEN:  No Specimen  DISPOSITION OF SPECIMEN:  N/A  COUNTS:  YES  TOURNIQUET:  * No tourniquets in log *  DICTATION: .Dragon Dictation  PLAN OF CARE: has admission in place  PATIENT DISPOSITION:  PACU - hemodynamically stable.   Delay start of Pharmacological VTE agent (>24hrs) due to surgical blood loss or risk of bleeding: not applicable Details of procedure: Patient was taken operating room, spinal anesthesia introduced, timeout confirmed and abdomen prepped and draped. Foley catheter was in place. Antibiotics were not required. A transverse subumbilical 2.0 cm long incision was made through the skin, the fascia identified and opened vertically, and peritoneum bluntly entered. It was no suspicion of complications. The right fallopian tube could be identified to its fimbriated end, elevated through the incision using Babcock clamps, fimbria were confirmed, and Filshie clip applied to the midportion of the tube. Marcaine was injected around the Filshie clip for pain relief. Tube was returned to the abdomen. The left fallopian tube was treated in a similar fashion. Marcaine was also injected. The fascia was closed with running 0 Vicryl, subcuticular 4-0 Vicryl close the skin, with these 2 procedures performed after instilling 120 cc of saline in the abdomen. Care was taken to evacuate as much of  the air as possible from the abdominal cavity. Steri-Strips were applied with benzoin in place. Patient to recovery room her Foley catheter will be removed upon return of normal sensation. Sponge and needle counts are correct 

## 2014-10-17 NOTE — Anesthesia Postprocedure Evaluation (Signed)
  Anesthesia Post-op Note  Patient: Kathy Sharp  Procedure(s) Performed: Procedure(s): POST PARTUM TUBAL LIGATION (Bilateral)  Patient Location: Mother/Baby  Anesthesia Type:Spinal  Level of Consciousness: awake  Airway and Oxygen Therapy: Patient Spontanous Breathing  Post-op Pain: mild  Post-op Assessment: Patient's Cardiovascular Status Stable and Respiratory Function Stable  Post-op Vital Signs: stable  Last Vitals:  Filed Vitals:   10/17/14 1521  BP: 126/57  Pulse: 84  Temp: 36.7 C  Resp: 18    Complications: No apparent anesthesia complications

## 2014-10-17 NOTE — Anesthesia Postprocedure Evaluation (Signed)
Anesthesia Post Note  Patient: Kathy Sharp  Procedure(s) Performed: Procedure(s) (LRB): POST PARTUM TUBAL LIGATION (Bilateral)  Anesthesia type: Spinal  Patient location: PACU  Post pain: Pain level controlled  Post assessment: Post-op Vital signs reviewed  Last Vitals:  Filed Vitals:   10/17/14 1045  BP: 96/58  Pulse: 78  Temp:   Resp: 14    Post vital signs: Reviewed  Level of consciousness: awake  Complications: No apparent anesthesia complications

## 2014-10-17 NOTE — Addendum Note (Signed)
Addendum  created 10/17/14 1534 by Renford DillsJanet L Anahli Arvanitis, CRNA   Modules edited: Notes Section   Notes Section:  File: [id number]

## 2014-10-17 NOTE — Anesthesia Preprocedure Evaluation (Addendum)
Anesthesia Evaluation  Patient identified by MRN, date of birth, ID band Patient awake    Reviewed: Allergy & Precautions, H&P , NPO status , Patient's Chart, lab work & pertinent test results  Airway Mallampati: I  TM Distance: >3 FB Neck ROM: full    Dental no notable dental hx. (+) Teeth Intact   Pulmonary neg pulmonary ROS,    Pulmonary exam normal       Cardiovascular negative cardio ROS      Neuro/Psych negative neurological ROS  negative psych ROS   GI/Hepatic negative GI ROS, Neg liver ROS,   Endo/Other  negative endocrine ROS  Renal/GU negative Renal ROS     Musculoskeletal   Abdominal Normal abdominal exam  (+)   Peds  Hematology negative hematology ROS (+) anemia , 10.6/33 PLTS 202   Anesthesia Other Findings Declined epidural for labor, allergic PCN,  cephalosporins ok, contact dermatitis to latex  Reproductive/Obstetrics                           Anesthesia Physical Anesthesia Plan  ASA: II  Anesthesia Plan: Spinal   Post-op Pain Management:    Induction:   Airway Management Planned: Nasal Cannula  Additional Equipment:   Intra-op Plan:   Post-operative Plan:   Informed Consent: I have reviewed the patients History and Physical, chart, labs and discussed the procedure including the risks, benefits and alternatives for the proposed anesthesia with the patient or authorized representative who has indicated his/her understanding and acceptance.     Plan Discussed with: CRNA and Surgeon  Anesthesia Plan Comments:        Anesthesia Quick Evaluation

## 2014-10-17 NOTE — Transfer of Care (Signed)
Immediate Anesthesia Transfer of Care Note  Patient: Kathy Sharp  Procedure(s) Performed: Procedure(s): POST PARTUM TUBAL LIGATION (Bilateral)  Patient Location: PACU  Anesthesia Type:Spinal  Level of Consciousness: awake, alert  and oriented  Airway & Oxygen Therapy: Patient Spontanous Breathing and Patient connected to nasal cannula oxygen  Post-op Assessment: Report given to RN and Post -op Vital signs reviewed and stable  Post vital signs: Reviewed and stable  Last Vitals:  Filed Vitals:   10/17/14 0904  BP: 115/67  Pulse: 78  Temp: 36.4 C  Resp: 18    Complications: No apparent anesthesia complications

## 2014-10-17 NOTE — Anesthesia Procedure Notes (Signed)
Spinal Patient location during procedure: OR Start time: 10/17/2014 9:23 AM End time: 10/17/2014 9:26 AM Staffing Anesthesiologist: Leilani AbleHATCHETT, Zionah Criswell Performed by: anesthesiologist  Preanesthetic Checklist Completed: patient identified, surgical consent, pre-op evaluation, timeout performed, IV checked, risks and benefits discussed and monitors and equipment checked Spinal Block Patient position: sitting Prep: DuraPrep Patient monitoring: heart rate, cardiac monitor, continuous pulse ox and blood pressure Approach: midline Location: L3-4 Injection technique: single-shot Needle Needle type: Pencan  Needle gauge: 24 G Needle length: 9 cm Needle insertion depth: 5 cm Assessment Sensory level: T6

## 2014-10-17 NOTE — Progress Notes (Signed)
Post Partum Day 1 Subjective:  Kathy Sharp is a 39 y.o. G3P3003 3650w3d s/p SVD.  No acute events overnight.  Pt denies problems with ambulating, voiding or po intake.  She denies nausea or vomiting.  Pain is well controlled.  She has not had flatus. She has not had bowel movement.  Lochia Moderate.  Plan for birth control is bilateral tubal ligation.  Method of Feeding: Breast/Bottle  Objective: Blood pressure 115/70, pulse 91, temperature 98.9 F (37.2 C), temperature source Oral, resp. rate 20, height 5\' 5"  (1.651 m), weight 146 lb (66.225 kg), last menstrual period 01/13/2014, unknown if currently breastfeeding.  Physical Exam:  General: alert, cooperative and no distress Lochia:normal flow Chest: CTAB, no increased WOB Heart: RRR no m/r/g Abdomen: +BS, soft, nontender  Uterine Fundus: firm, below level of umbilicus DVT Evaluation: No evidence of DVT seen on physical exam. Extremities: WWP, no edema, +2DP   Recent Labs  10/16/14 1740  HGB 10.6*  HCT 33.1*    Assessment/Plan:  ASSESSMENT: Kathy MaeManasha Kosh is a 39 y.o. [id number] 7250w3d s/p SVD  Plan for discharge tomorrow and Breastfeeding   LOS: 1 day   Delynn FlavinGottschalk, Ashly M, DO 10/17/2014, 5:56 AM

## 2014-10-17 NOTE — Brief Op Note (Signed)
10/16/2014 - 10/17/2014  10:05 AM  PATIENT:  Kathy Sharp  39 y.o. female  PRE-OPERATIVE DIAGNOSIS:  desired sterilization  POST-OPERATIVE DIAGNOSIS:  desired sterilization, performed  PROCEDURE:  Procedure(s): POST PARTUM TUBAL LIGATION (Bilateral) Filshie Clip placement SURGEON:  Surgeon(s) and Role:    * Tilda BurrowJohn Therman Hughlett V, MD - Primary  PHYSICIAN ASSISTANT:   ASSISTANTS: none   ANESTHESIA:   spinal  EBL:  Total I/O In: 1000 [I.V.:1000] Out: 5 [Blood:5]  BLOOD ADMINISTERED:none  DRAINS: Urinary Catheter (Foley)  D/c in RR  LOCAL MEDICATIONS USED:  MARCAINE    and Amount: 14 ml  SPECIMEN:  No Specimen  DISPOSITION OF SPECIMEN:  N/A  COUNTS:  YES  TOURNIQUET:  * No tourniquets in log *  DICTATION: .Dragon Dictation  PLAN OF CARE: has admission in place  PATIENT DISPOSITION:  PACU - hemodynamically stable.   Delay start of Pharmacological VTE agent (>24hrs) due to surgical blood loss or risk of bleeding: not applicable Details of procedure: Patient was taken operating room, spinal anesthesia introduced, timeout confirmed and abdomen prepped and draped. Foley catheter was in place. Antibiotics were not required. A transverse subumbilical 2.0 cm long incision was made through the skin, the fascia identified and opened vertically, and peritoneum bluntly entered. It was no suspicion of complications. The right fallopian tube could be identified to its fimbriated end, elevated through the incision using Babcock clamps, fimbria were confirmed, and Filshie clip applied to the midportion of the tube. Marcaine was injected around the Filshie clip for pain relief. Tube was returned to the abdomen. The left fallopian tube was treated in a similar fashion. Marcaine was also injected. The fascia was closed with running 0 Vicryl, subcuticular 4-0 Vicryl close the skin, with these 2 procedures performed after instilling 120 cc of saline in the abdomen. Care was taken to evacuate as much of  the air as possible from the abdominal cavity. Steri-Strips were applied with benzoin in place. Patient to recovery room her Foley catheter will be removed upon return of normal sensation. Sponge and needle counts are correct

## 2014-10-17 NOTE — Lactation Note (Signed)
This note was copied from the chart of Kathy Sharp Span. Lactation Consultation Note Mom P3 fist time BF, Has just given bottle of formula. Reviewed importance for frequent nursing to promote a good milk supply. Mom states she wants to bottle feed during the day and breast feed in the evening. Discussed supply and demand. Reviewed engorgement prevention and treatment. Asking about milk storage- reviewed guidelines with her. Has pump at home but unsure of brand. Asking about stopping breast feeding- reviewed gradual weaning with mom. Reports baby has not fed since 7:30 am. Encouraged to feed q 2-3 hours at the breast then supplement if baby is still hungry, reviewed awakening techniques. Bf brochure given with resources for support after DC. No questions at present. To call for assist prn  Patient Name: Kathy Sharp Dopson ZOXWR'UToday's Date: 10/17/2014 Reason for consult: Initial assessment   Maternal Data Formula Feeding for Exclusion: Yes Reason for exclusion: Mother's choice to formula and breast feed on admission Does the patient have breastfeeding experience prior to this delivery?: No  Feeding    LATCH Score/Interventions                      Lactation Tools Discussed/Used     Consult Status Consult Status: PRN    Pamelia HoitWeeks, Derak Schurman D 10/17/2014, 2:48 PM

## 2014-10-18 ENCOUNTER — Encounter (HOSPITAL_COMMUNITY): Payer: Self-pay | Admitting: Obstetrics and Gynecology

## 2014-10-18 MED ORDER — OXYCODONE-ACETAMINOPHEN 5-325 MG PO TABS: 1.0000 | ORAL_TABLET | ORAL | Status: DC | PRN

## 2014-10-18 MED ORDER — IBUPROFEN 600 MG PO TABS: 600.0000 mg | ORAL_TABLET | Freq: Four times a day (QID) | ORAL | Status: DC

## 2014-10-18 NOTE — Discharge Summary (Signed)
Obstetric Discharge Summary Reason for Admission: onset of labor Prenatal Procedures: ultrasound Intrapartum Procedures: spontaneous vaginal delivery Postpartum Procedures: P.P. tubal ligation by Filshie Clip placement Complications-Operative and Postpartum: none HEMOGLOBIN  Date Value Ref Range Status  10/16/2014 10.6* 12.0 - 15.0 g/dL Final  16/10/960404/18/2015 54.012.8 12.2 - 16.2 g/dL Final   HCT  Date Value Ref Range Status  10/16/2014 33.1* 36.0 - 46.0 % Final   HCT, POC  Date Value Ref Range Status  10/17/2013 41.0 37.7 - 47.9 % Final    Physical Exam:  General: alert, cooperative, appears stated age and no distress Lochia: appropriate Uterine Fundus: firm Incision: no significant drainage, no dehiscence, no significant erythema DVT Evaluation: No evidence of DVT seen on physical exam. Negative Homan's sign.  Discharge Diagnoses: Term Pregnancy-delivered  Discharge Information: Date: 10/18/2014 Activity: pelvic rest Diet: routine Medications: PNV, Ibuprofen and Percocet Condition: stable and improved Instructions: refer to practice specific booklet Discharge to: home   Newborn Data: Live born female  Birth Weight: 6 lb 8.1 oz (2950 g) APGAR: 9, 9  Home with mother.  LAWSON, Kathy Sharp 10/18/2014, 7:00 AM

## 2014-10-18 NOTE — Progress Notes (Signed)
UR chart review completed.  

## 2014-10-21 ENCOUNTER — Encounter: Payer: Medicaid Other | Admitting: Obstetrics and Gynecology

## 2014-11-26 ENCOUNTER — Encounter: Payer: Self-pay | Admitting: *Deleted

## 2015-08-25 ENCOUNTER — Encounter: Payer: Self-pay | Admitting: Emergency Medicine

## 2015-08-25 ENCOUNTER — Emergency Department
Admission: EM | Admit: 2015-08-25 | Discharge: 2015-08-25 | Disposition: A | Discharge status: Home / Self Care | Payer: Medicaid Other | Attending: Emergency Medicine | Admitting: Emergency Medicine

## 2015-08-25 DIAGNOSIS — H9203 Otalgia, bilateral: Secondary | ICD-10-CM | POA: Diagnosis not present

## 2015-08-25 DIAGNOSIS — Z880 Allergy status to penicillin: Secondary | ICD-10-CM | POA: Insufficient documentation

## 2015-08-25 DIAGNOSIS — Z91040 Latex allergy status: Secondary | ICD-10-CM | POA: Diagnosis not present

## 2015-08-25 DIAGNOSIS — K529 Noninfective gastroenteritis and colitis, unspecified: Secondary | ICD-10-CM | POA: Diagnosis not present

## 2015-08-25 DIAGNOSIS — J3489 Other specified disorders of nose and nasal sinuses: Secondary | ICD-10-CM | POA: Diagnosis not present

## 2015-08-25 DIAGNOSIS — Z79899 Other long term (current) drug therapy: Secondary | ICD-10-CM | POA: Insufficient documentation

## 2015-08-25 DIAGNOSIS — J029 Acute pharyngitis, unspecified: Secondary | ICD-10-CM | POA: Diagnosis not present

## 2015-08-25 DIAGNOSIS — R112 Nausea with vomiting, unspecified: Secondary | ICD-10-CM | POA: Diagnosis present

## 2015-08-25 DIAGNOSIS — H538 Other visual disturbances: Secondary | ICD-10-CM | POA: Insufficient documentation

## 2015-08-25 DIAGNOSIS — Z3202 Encounter for pregnancy test, result negative: Secondary | ICD-10-CM | POA: Insufficient documentation

## 2015-08-25 DIAGNOSIS — Z791 Long term (current) use of non-steroidal anti-inflammatories (NSAID): Secondary | ICD-10-CM | POA: Insufficient documentation

## 2015-08-25 DIAGNOSIS — R079 Chest pain, unspecified: Secondary | ICD-10-CM | POA: Diagnosis not present

## 2015-08-25 DIAGNOSIS — L539 Erythematous condition, unspecified: Secondary | ICD-10-CM | POA: Insufficient documentation

## 2015-08-25 LAB — BASIC METABOLIC PANEL
ANION GAP: 8 (ref 5–15)
BUN: 12 mg/dL (ref 6–20)
CALCIUM: 9.6 mg/dL (ref 8.9–10.3)
CO2: 26 mmol/L (ref 22–32)
Chloride: 104 mmol/L (ref 101–111)
Creatinine, Ser: 0.79 mg/dL (ref 0.44–1.00)
GFR calc Af Amer: >60 mL/min (ref >60)
GFR calc non Af Amer: >60 mL/min (ref >60)
GLUCOSE: 91 mg/dL (ref 65–99)
Potassium: 3.6 mmol/L (ref 3.5–5.1)
Sodium: 138 mmol/L (ref 135–145)

## 2015-08-25 LAB — URINALYSIS COMPLETE WITH MICROSCOPIC (ARMC ONLY)
Bacteria, UA: NONE SEEN
Bilirubin Urine: NEGATIVE
GLUCOSE, UA: NEGATIVE mg/dL
Hgb urine dipstick: NEGATIVE
KETONES UR: NEGATIVE mg/dL
Leukocytes, UA: NEGATIVE
NITRITE: NEGATIVE
Protein, ur: NEGATIVE mg/dL
Specific Gravity, Urine: 1.029 (ref 1.005–1.030)
pH: 5 (ref 5.0–8.0)

## 2015-08-25 LAB — CBC WITH DIFFERENTIAL/PLATELET
BASOS PCT: 1 %
Basophils Absolute: 0 10*3/uL (ref 0–0.1)
EOS PCT: 0 %
Eosinophils Absolute: 0 10*3/uL (ref 0–0.7)
HCT: 36.6 % (ref 35.0–47.0)
Hemoglobin: 12 g/dL (ref 12.0–16.0)
Lymphocytes Relative: 31 %
Lymphs Abs: 1.2 10*3/uL (ref 1.0–3.6)
MCH: 26.5 pg (ref 26.0–34.0)
MCHC: 32.7 g/dL (ref 32.0–36.0)
MCV: 81.1 fL (ref 80.0–100.0)
MONO ABS: 0.9 10*3/uL (ref 0.2–0.9)
Monocytes Relative: 22 %
NEUTROS ABS: 1.8 10*3/uL (ref 1.4–6.5)
Neutrophils Relative %: 46 %
Platelets: 231 10*3/uL (ref 150–440)
RBC: 4.51 MIL/uL (ref 3.80–5.20)
RDW: 16 % — AB (ref 11.5–14.5)
WBC: 3.8 10*3/uL (ref 3.6–11.0)

## 2015-08-25 LAB — POCT PREGNANCY, URINE: Preg Test, Ur: NEGATIVE

## 2015-08-25 MED ORDER — SODIUM CHLORIDE 0.9 % IV BOLUS (SEPSIS)
1000.0000 mL | Freq: Once | INTRAVENOUS | Status: AC
  Administered 2015-08-25: 1000 mL via INTRAVENOUS

## 2015-08-25 MED ORDER — ONDANSETRON HCL 8 MG PO TABS: 8.0000 mg | ORAL_TABLET | Freq: Three times a day (TID) | ORAL | Status: DC | PRN

## 2015-08-25 MED ORDER — DIPHENOXYLATE-ATROPINE 2.5-0.025 MG PO TABS
2.0000 | ORAL_TABLET | Freq: Once | ORAL | Status: AC
  Administered 2015-08-25: 2 via ORAL
  Filled 2015-08-25: qty 2

## 2015-08-25 MED ORDER — ONDANSETRON HCL 4 MG/2ML IJ SOLN
4.0000 mg | Freq: Once | INTRAMUSCULAR | Status: AC
  Administered 2015-08-25: 4 mg via INTRAVENOUS
  Filled 2015-08-25: qty 2

## 2015-08-25 MED ORDER — DIPHENOXYLATE-ATROPINE 2.5-0.025 MG PO TABS: 1.0000 | ORAL_TABLET | Freq: Four times a day (QID) | ORAL | Status: AC | PRN

## 2015-08-25 NOTE — ED Provider Notes (Signed)
[facility] Emergency Department Provider Note  ____________________________________________  Time seen: Approximately 2:36 PM  I have reviewed the triage vital signs and the nursing notes.   HISTORY  Chief Complaint Emesis    HPI Kathy Sharp is a 40 y.o. female who presents with nausea, vomiting, diarrhea and cough for one week. She presented to an urgent care this morning, but they told her to go to the ED. She has decreased appetite with her last meal being a bagel yesterday. She is only able to keep down sips of tea. She also complains of sore throat, rhinorrhea, fatigue, lightheadedness, fever, chills, headache, sweats, pale stools, chest pain with deep breaths and  bilateral ear pain. She denies hematemesis, hemoptysis, hematochezia, dyspnea, and LOC. She has tried Geneticist, molecular, Thermaflu, and Tylenol PM with some relief. Nothing makes her symptoms worse. Her symptoms are worse at night. The patient ranks her discomfort to be a 7/10 and as high as a 9/10. She has not gotten a flu shot and is unsure if other immunizations are UTD. Sick contacts include her one-year-old.    Past Medical History  Diagnosis Date  . Anemia   . Bacterial vaginosis     Patient Active Problem List   Diagnosis Date Noted  . NSVD (normal spontaneous vaginal delivery) 10/17/2014  . Encounter for sterilization Postpartum  10/17/2014  . Group B streptococcal bacteriuria 09/27/2014  . Abnormal ultrasonic finding on antenatal screening of mother 07/09/2014  . Supervision of other normal pregnancy 05/26/2014  . AMA (advanced maternal age) multigravida 35+ 05/26/2014    Past Surgical History  Procedure Laterality Date  . Tubal ligation Bilateral 10/17/2014    Procedure: POST PARTUM TUBAL LIGATION;  Surgeon: Tilda Burrow, MD;  Location: [facility];  Service: Gynecology;  Laterality: Bilateral;    Current Outpatient Rx  Name  Route  Sig  Dispense  Refill  . diphenoxylate-atropine  (LOMOTIL) 2.5-0.025 MG tablet   Oral   Take 1 tablet by mouth 4 (four) times daily as needed for diarrhea or loose stools.   8 tablet   0   . EPINEPHrine (EPI-PEN) 0.3 mg/0.3 mL DEVI   Intramuscular   Inject 0.3 mLs (0.3 mg total) into the muscle once. In event of severe allergic reaction.   1 Device   0   . ibuprofen (ADVIL,MOTRIN) 600 MG tablet   Oral   Take 1 tablet (600 mg total) by mouth every 6 (six) hours.   30 tablet   0   . ondansetron (ZOFRAN) 8 MG tablet   Oral   Take 1 tablet (8 mg total) by mouth every 8 (eight) hours as needed for nausea or vomiting.   9 tablet   0   . oxyCODONE-acetaminophen (PERCOCET/ROXICET) 5-325 MG per tablet   Oral   Take 1-2 tablets by mouth every 4 (four) hours as needed (for pain scale 4-7).   30 tablet   0   . Prenatal Vit-Fe Fumarate-FA (PRENATAL MULTIVITAMIN) TABS tablet   Oral   Take 1 tablet by mouth daily.           Allergies Penicillins and Latex  Family History  Problem Relation Age of Onset  . Cancer Mother     Social History Social History  Substance Use Topics  . Smoking status: Never Smoker   . Smokeless tobacco: Never Used  . Alcohol Use: No     Comment: OCC    Review of Systems Constitutional: Endorses fever/chills Eyes: Endorses visual changes. ENT:  Endorses sore throat, rhinorrhea, earache Cardiovascular: Endorses chest pain with deep breathing Respiratory: Denies shortness of breath, dyspnea on exertion Gastrointestinal: Endorses abdominal pain, nausea, vomiting, diarrhea and possibly constipation. Musculoskeletal: Myalgias Skin: Negative for rash. Neurological: Endorses headaches and general weakness 10-point ROS otherwise negative.  ____________________________________________   PHYSICAL EXAM:  VITAL SIGNS: ED Triage Vitals  Enc Vitals Group     BP 08/25/15 1334 113/78 mmHg     Pulse Rate 08/25/15 1334 93     Resp 08/25/15 1334 18     Temp 08/25/15 1334 98.3 F (36.8 C)     Temp  Source 08/25/15 1334 Oral     SpO2 08/25/15 1334 100 %     Weight 08/25/15 1334 118 lb (53.524 kg)     Height 08/25/15 1334  (1.626 m)     Head Cir --      Peak Flow --      Pain Score 08/25/15 1348 5     Pain Loc --      Pain Edu? --      Excl. in GC? --     Constitutional: Alert and oriented. Appears ill and fatigued, in no acute distress. Eyes: Conjunctivae are normal. PERRL. EOMI. Head: Atraumatic. Nose: No congestion/rhinnorhea. Erythema  Mouth/Throat: Mucous membranes are dry.  Oropharynx non-erythematous. Hematological/Lymphatic/Immunilogical: No cervical lymphadenopathy. Cardiovascular: Normal rate, regular rhythm. Grossly normal heart sounds.   Respiratory: Normal respiratory effort.  No retractions. Lungs CTAB. Gastrointestinal: Soft and tender in all four quadrants. No distention. No abdominal bruits. Neurologic:  Normal speech and language. No gross focal neurologic deficits are appreciated. No gait instability. Skin:  Skin is warm, dry and intact. No rash noted. Psychiatric: Mood and affect are normal. Speech and behavior are normal.  ____________________________________________   LABS (all labs ordered are listed, but only abnormal results are displayed)  Labs Reviewed  CBC WITH DIFFERENTIAL/PLATELET - Abnormal; Notable for the following:    RDW 16.0 (*)    All other components within normal limits  URINALYSIS COMPLETEWITH MICROSCOPIC ([facility] ONLY) - Abnormal; Notable for the following:    Color, Urine YELLOW (*)    APPearance HAZY (*)    Squamous Epithelial / LPF 6-30 (*)    All other components within normal limits  BASIC METABOLIC PANEL  POC URINE PREG, ED  POCT PREGNANCY, URINE   ____________________________________________  EKG   ____________________________________________  RADIOLOGY   ____________________________________________   PROCEDURES  Procedure(s) performed: None  Critical Care performed:  No  ____________________________________________   INITIAL IMPRESSION / ASSESSMENT AND PLAN / ED COURSE  Pertinent labs & imaging results that were available during my care of the patient were reviewed by me and considered in my medical decision making (see chart for details).  Gastroenteritis. Patient given discharge care instructions. Patient given a prescription for Lomotil and Zofran. He advised follow-up family doctor if no improvement return to ER if condition worsens. Patient was given a work note. ____________________________________________   FINAL CLINICAL IMPRESSION(S) / ED DIAGNOSES  Final diagnoses:  Gastroenteritis      Joni Reining, PA-C 08/25/15 1539  Arnaldo Natal, MD 08/25/15 [phone]

## 2015-08-25 NOTE — ED Notes (Signed)
Reports n/v/d for several days, now has cough , congestion and runny nose.

## 2015-08-25 NOTE — ED Notes (Signed)
States developed some body aches with some fever last week.. Had some diarrhea couple of days ago  But still having some nausea with vomiting  Last time vomited was at 3 am

## 2015-08-28 IMAGING — US US OB COMP LESS 14 WK
1 series · 13 of 28 positions shown · non-contrast
Comparison: None.

CLINICAL DATA: Pelvic cramping for 2 weeks.  Vaginal discharge.

EXAM:
OBSTETRIC <14 WK US AND TRANSVAGINAL OB US
TECHNIQUE: Both transabdominal and transvaginal ultrasound examinations were
performed for complete evaluation of the gestation as well as the
maternal uterus, adnexal regions, and pelvic cul-de-sac.
Transvaginal technique was performed to assess early pregnancy.

[Series 1: us ob comp less 14 wks · 13 of 66 slices shown]
[im 3/66]
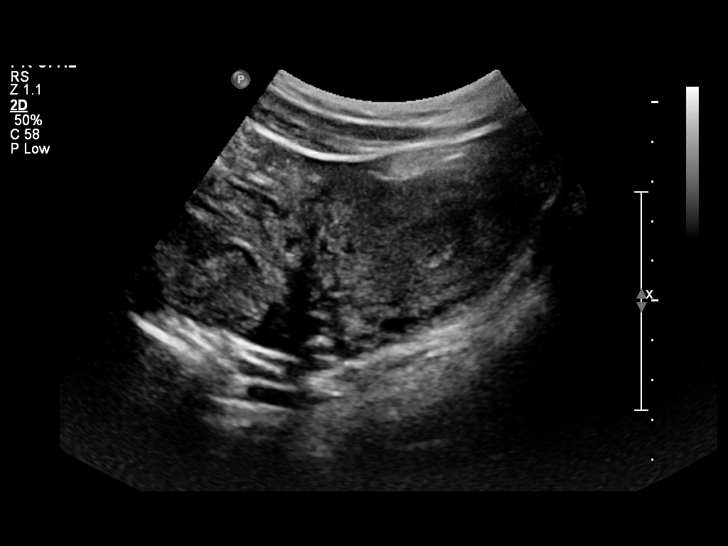
[im 8/66]
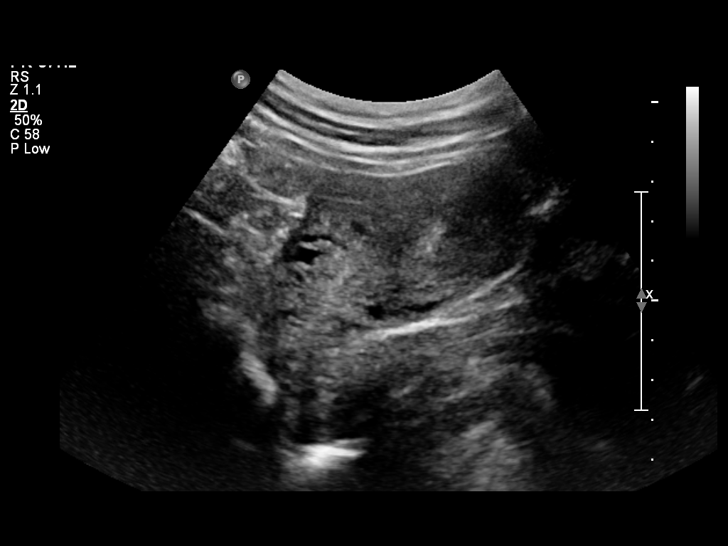
[im 13/66]
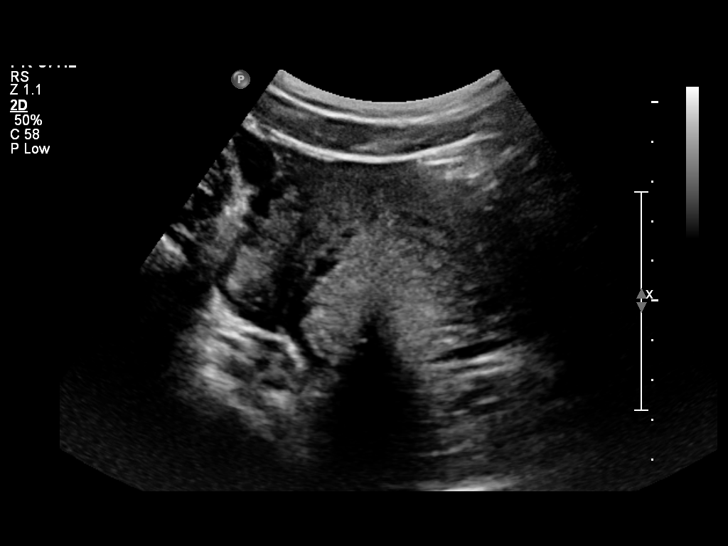
[im 17/66]
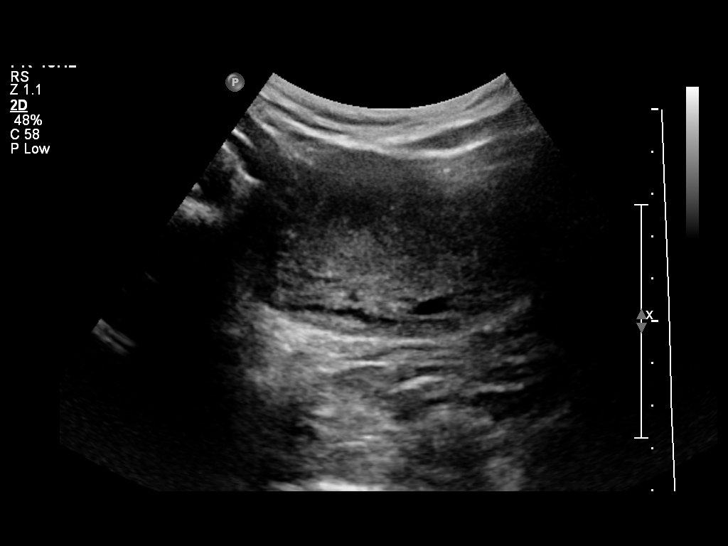
[im 22/66]
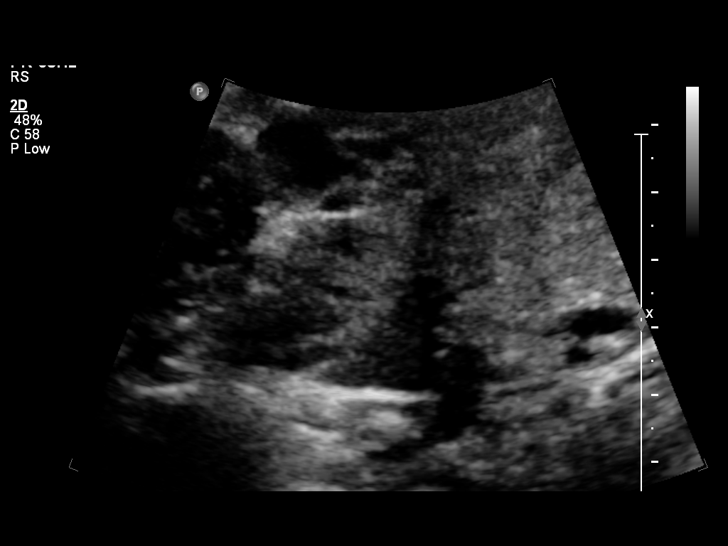
[im 27/66]
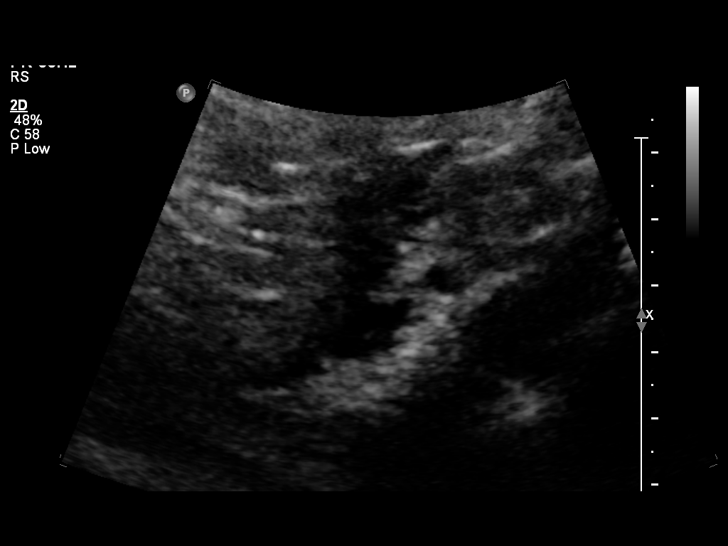
[im 34/66]
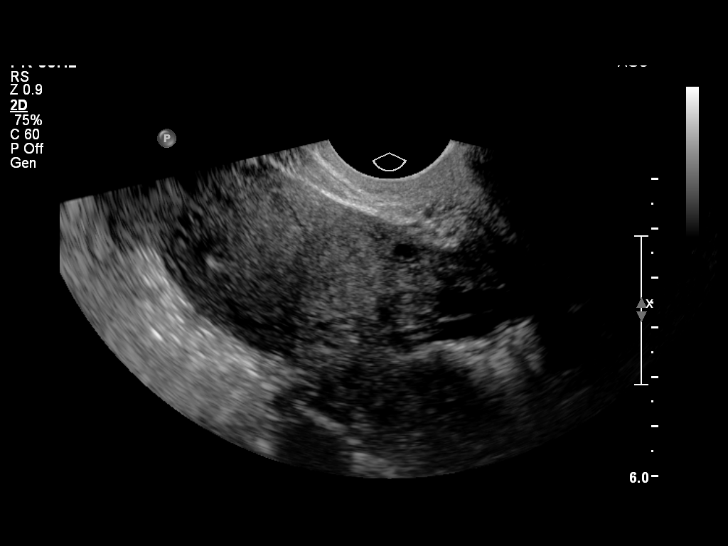
[im 39/66]
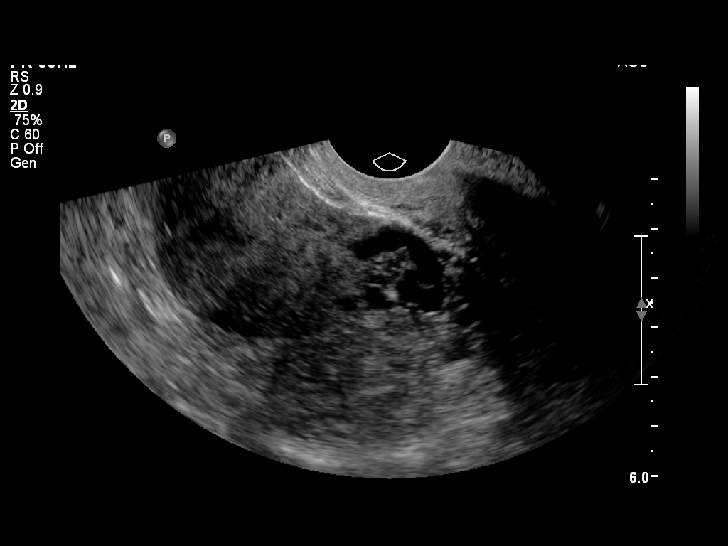
[im 44/66]
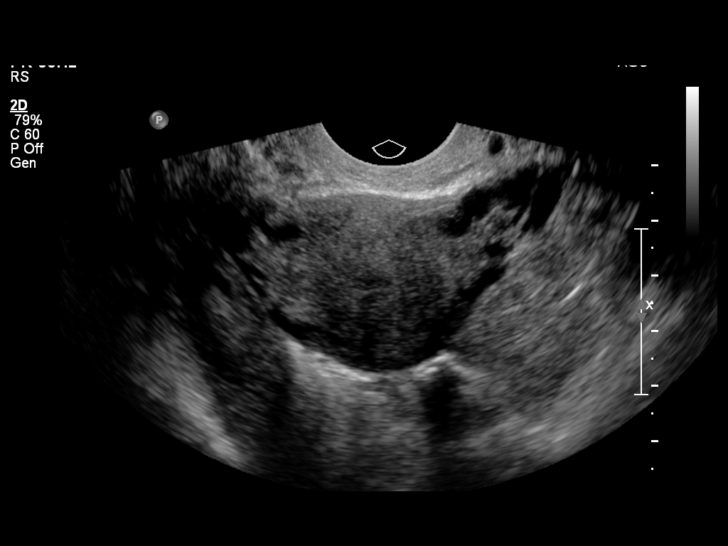
[im 49/66]
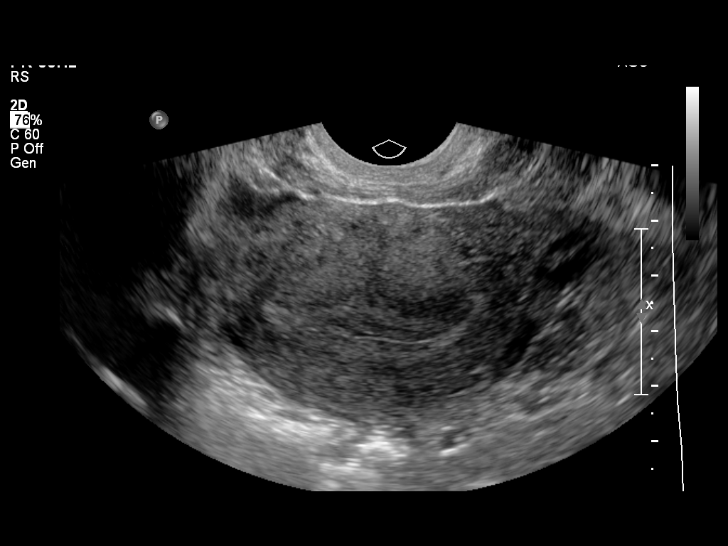
[im 53/66]
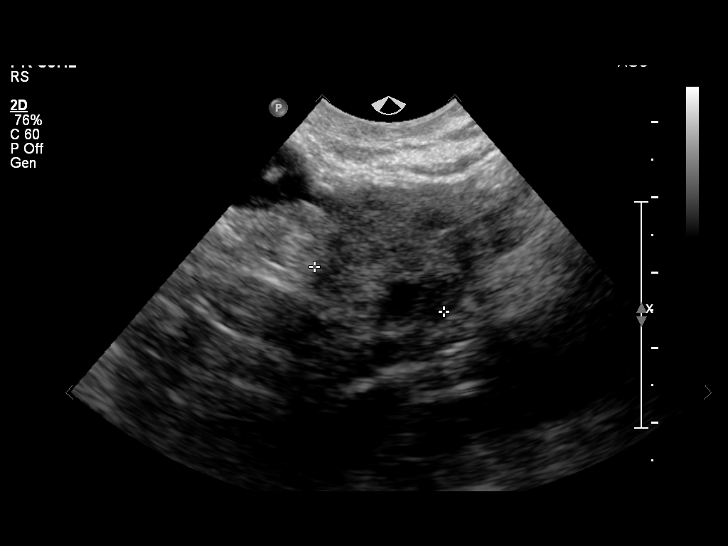
[im 58/66]
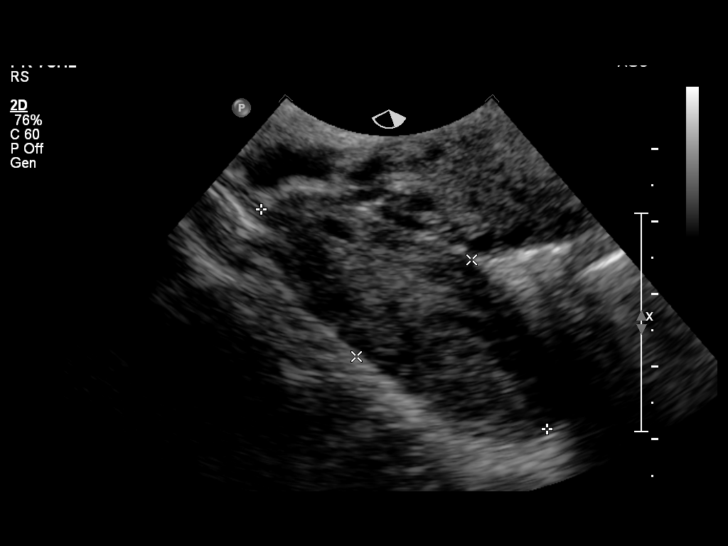
[im 63/66]
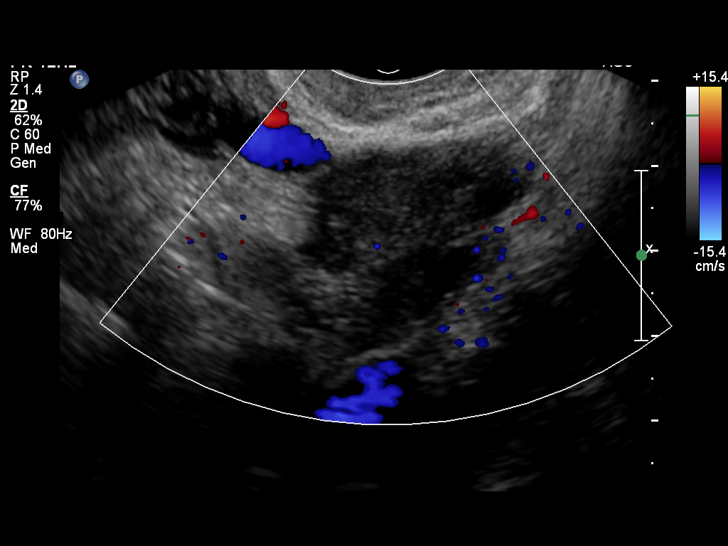

[13 of 28 positions shown; findings below may reference images not displayed]

FINDINGS: Intrauterine gestational sac:  None seen.

Yolk sac:  N/A

Embryo:  N/A

Maternal uterus/adnexae: The uterus is unremarkable in appearance.

The ovaries are within normal limits. The right ovary measures 5.0 x
2.1 x 2.6 cm, while the left ovary measures 3.3 x 1.9 x 1.8 cm. No
suspicious adnexal masses are seen; there is no evidence for
torsion.

A small amount of free fluid is seen within the pelvic cul-de-sac.
IMPRESSION: No intrauterine gestational sac seen at this time. No definite
evidence for ectopic pregnancy. Given the relatively low
quantitative beta HCG, a intrauterine pregnancy may not yet be
visible. Would follow-up quantitative beta HCG levels. If they
continue to rise, follow-up pelvic ultrasound could be performed in
1-2 weeks for further evaluation.

## 2015-10-12 ENCOUNTER — Emergency Department
Admission: EM | Admit: 2015-10-12 | Discharge: 2015-10-12 | Disposition: A | Discharge status: Home / Self Care | Payer: Medicaid Other | Attending: Emergency Medicine | Admitting: Emergency Medicine

## 2015-10-12 ENCOUNTER — Encounter: Payer: Self-pay | Admitting: Emergency Medicine

## 2015-10-12 DIAGNOSIS — Z791 Long term (current) use of non-steroidal anti-inflammatories (NSAID): Secondary | ICD-10-CM | POA: Insufficient documentation

## 2015-10-12 DIAGNOSIS — N760 Acute vaginitis: Secondary | ICD-10-CM | POA: Insufficient documentation

## 2015-10-12 DIAGNOSIS — B9689 Other specified bacterial agents as the cause of diseases classified elsewhere: Secondary | ICD-10-CM

## 2015-10-12 DIAGNOSIS — R102 Pelvic and perineal pain: Secondary | ICD-10-CM | POA: Diagnosis present

## 2015-10-12 LAB — URINALYSIS COMPLETE WITH MICROSCOPIC (ARMC ONLY)
BILIRUBIN URINE: NEGATIVE
Glucose, UA: NEGATIVE mg/dL
Hgb urine dipstick: NEGATIVE
Leukocytes, UA: NEGATIVE
Nitrite: NEGATIVE
PH: 6 (ref 5.0–8.0)
Protein, ur: NEGATIVE mg/dL
Specific Gravity, Urine: 1.024 (ref 1.005–1.030)

## 2015-10-12 LAB — WET PREP, GENITAL
Sperm: NONE SEEN
Trich, Wet Prep: NONE SEEN
YEAST WET PREP: NONE SEEN

## 2015-10-12 LAB — GLUCOSE, CAPILLARY: GLUCOSE-CAPILLARY: 69 mg/dL (ref 65–99)

## 2015-10-12 LAB — CHLAMYDIA/NGC RT PCR (ARMC ONLY)
Chlamydia Tr: NOT DETECTED
N GONORRHOEAE: NOT DETECTED

## 2015-10-12 MED ORDER — METRONIDAZOLE 500 MG PO TABS: 500.0000 mg | ORAL_TABLET | Freq: Two times a day (BID) | ORAL | Status: AC

## 2015-10-12 NOTE — Discharge Instructions (Signed)
Bacterial Vaginosis Bacterial vaginosis is a vaginal infection that occurs when the normal balance of bacteria in the vagina is disrupted. It results from an overgrowth of certain bacteria. This is the most common vaginal infection in women of childbearing age. Treatment is important to prevent complications, especially in pregnant women, as it can cause a premature delivery. CAUSES  Bacterial vaginosis is caused by an increase in harmful bacteria that are normally present in smaller amounts in the vagina. Several different kinds of bacteria can cause bacterial vaginosis. However, the reason that the condition develops is not fully understood. RISK FACTORS Certain activities or behaviors can put you at an increased risk of developing bacterial vaginosis, including:  Having a new sex partner or multiple sex partners.  Douching.  Using an intrauterine device (IUD) for contraception. Women do not get bacterial vaginosis from toilet seats, bedding, swimming pools, or contact with objects around them. SIGNS AND SYMPTOMS  Some women with bacterial vaginosis have no signs or symptoms. Common symptoms include:  Grey vaginal discharge.  A fishlike odor with discharge, especially after sexual intercourse.  Itching or burning of the vagina and vulva.  Burning or pain with urination. DIAGNOSIS  Your health care provider will take a medical history and examine the vagina for signs of bacterial vaginosis. A sample of vaginal fluid may be taken. Your health care provider will look at this sample under a microscope to check for bacteria and abnormal cells. A vaginal pH test may also be done.  TREATMENT  Bacterial vaginosis may be treated with antibiotic medicines. These may be given in the form of a pill or a vaginal cream. A second round of antibiotics may be prescribed if the condition comes back after treatment. Because bacterial vaginosis increases your risk for sexually transmitted diseases, getting  treated can help reduce your risk for chlamydia, gonorrhea, HIV, and herpes. HOME CARE INSTRUCTIONS   Only take over-the-counter or prescription medicines as directed by your health care provider.  If antibiotic medicine was prescribed, take it as directed. Make sure you finish it even if you start to feel better.  Tell all sexual partners that you have a vaginal infection. They should see their health care provider and be treated if they have problems, such as a mild rash or itching.  During treatment, it is important that you follow these instructions:  Avoid sexual activity or use condoms correctly.  Do not douche.  Avoid alcohol as directed by your health care provider.  Avoid breastfeeding as directed by your health care provider. SEEK MEDICAL CARE IF:   Your symptoms are not improving after 3 days of treatment.  You have increased discharge or pain.  You have a fever. MAKE SURE YOU:   Understand these instructions.  Will watch your condition.  Will get help right away if you are not doing well or get worse. FOR MORE INFORMATION  Centers for Disease Control and Prevention, Division of STD Prevention: SolutionApps.co.zawww.cdc.gov/std American Sexual Health Association (ASHA): www.ashastd.org    This information is not intended to replace advice given to you by your health care provider. Make sure you discuss any questions you have with your health care provider.   Document Released: 06/18/2005 Document Revised: 07/09/2014 Document Reviewed: 01/28/2013 Elsevier Interactive Patient Education [employer]2016 Elsevier Inc.  Please return immediately if condition worsens. Please contact her primary physician or the physician you were given for referral. If you have any specialist physicians involved in her treatment and plan please also contact them. Thank  you for using [place] [facility] emergency [facility].

## 2015-10-12 NOTE — ED Provider Notes (Signed)
Time Seen: Approximately 1440  I have reviewed the triage notes  Chief Complaint: Pelvic Pain   History of Present Illness: Kathy Sharp is a 40 y.o. female who presents with some intermittent pelvic discomfort and intermittent episodes of bacterial vaginosis and/or urinary tract infections now for the past year. She is not sure physical incidental but seemed to occur after delivery of her last child and she had a tubal ligation at that time. Rates that she has not had any dysuria, hematuria or urinary frequency though she has noticed some increased vaginal discharge described as gray in appearance. She states it feels and looks like her bacterial vaginosis. Patient states that she's tried multiple means to try to prevent infections and her boyfriend has also been tested. She states that he have a tendency to recur after her menstrual period which she just completed. She denies any fever or abdominal pain.  Past Medical History  Diagnosis Date  . Anemia   . Bacterial vaginosis     Patient Active Problem List   Diagnosis Date Noted  . NSVD (normal spontaneous vaginal delivery) 10/17/2014  . Encounter for sterilization Postpartum  10/17/2014  . Group B streptococcal bacteriuria 09/27/2014  . Abnormal ultrasonic finding on antenatal screening of mother 07/09/2014  . Supervision of other normal pregnancy 05/26/2014  . AMA (advanced maternal age) multigravida 35+ 05/26/2014    Past Surgical History  Procedure Laterality Date  . Tubal ligation Bilateral 10/17/2014    Procedure: POST PARTUM TUBAL LIGATION;  Surgeon: Tilda Burrow, MD;  Location: [facility];  Service: Gynecology;  Laterality: Bilateral;    Past Surgical History  Procedure Laterality Date  . Tubal ligation Bilateral 10/17/2014    Procedure: POST PARTUM TUBAL LIGATION;  Surgeon: Tilda Burrow, MD;  Location: [facility];  Service: Gynecology;  Laterality: Bilateral;    Current Outpatient Rx  Name  Route  Sig  Dispense   Refill  . diphenoxylate-atropine (LOMOTIL) 2.5-0.025 MG tablet   Oral   Take 1 tablet by mouth 4 (four) times daily as needed for diarrhea or loose stools.   8 tablet   0   . EPINEPHrine (EPI-PEN) 0.3 mg/0.3 mL DEVI   Intramuscular   Inject 0.3 mLs (0.3 mg total) into the muscle once. In event of severe allergic reaction.   1 Device   0   . ibuprofen (ADVIL,MOTRIN) 600 MG tablet   Oral   Take 1 tablet (600 mg total) by mouth every 6 (six) hours.   30 tablet   0   . ondansetron (ZOFRAN) 8 MG tablet   Oral   Take 1 tablet (8 mg total) by mouth every 8 (eight) hours as needed for nausea or vomiting.   9 tablet   0   . oxyCODONE-acetaminophen (PERCOCET/ROXICET) 5-325 MG per tablet   Oral   Take 1-2 tablets by mouth every 4 (four) hours as needed (for pain scale 4-7).   30 tablet   0   . Prenatal Vit-Fe Fumarate-FA (PRENATAL MULTIVITAMIN) TABS tablet   Oral   Take 1 tablet by mouth daily.           Allergies:  Penicillins and Latex  Family History: Family History  Problem Relation Age of Onset  . Cancer Mother     Social History: Social History  Substance Use Topics  . Smoking status: Never Smoker   . Smokeless tobacco: Never Used  . Alcohol Use: No     Comment: OCC  Review of Systems:   10 point review of systems was performed and was otherwise negative:  Constitutional: No fever Eyes: No visual disturbances ENT: No sore throat, ear pain Cardiac: No chest pain Respiratory: No shortness of breath, wheezing, or stridor Abdomen: No abdominal pain, no vomiting, No diarrhea Endocrine: No weight loss, No night sweats Extremities: No peripheral edema, cyanosis Skin: No rashes, easy bruising Neurologic: No focal weakness, trouble with speech or swollowing Urologic: No dysuria, Hematuria, or urinary frequency   Physical Exam:  ED Triage Vitals  Enc Vitals Group     BP 10/12/15 1346 110/75 mmHg     Pulse Rate 10/12/15 1346 77     Resp 10/12/15  1346 16     Temp 10/12/15 1346 98.4 F (36.9 C)     Temp Source 10/12/15 1346 Oral     SpO2 10/12/15 1346 100 %     Weight 10/12/15 1346 117 lb (53.071 kg)     Height 10/12/15 1346 5\' 4"  (1.626 m)     Head Cir --      Peak Flow --      Pain Score --      Pain Loc --      Pain Edu? --      Excl. in GC? --     General: Awake , Alert , and Oriented times 3; GCS 15 Head: Normal cephalic , atraumatic Eyes: Pupils equal , round, reactive to light Nose/Throat: No nasal drainage, patent upper airway without erythema or exudate.  Neck: Supple, Full range of motion, No anterior adenopathy or palpable thyroid masses Lungs: Clear to ascultation without wheezes , rhonchi, or rales Heart: Regular rate, regular rhythm without murmurs , gallops , or rubs Abdomen: Soft, non tender without rebound, guarding , or rigidity; bowel sounds positive and symmetric in all 4 quadrants. No organomegaly .        Extremities: 2 plus symmetric pulses. No edema, clubbing or cyanosis Neurologic: normal ambulation, Motor symmetric without deficits, sensory intact Skin: warm, dry, no rashes Pelvic exam with chaperone present does show some foamy white to gray tinged discharge with no cervical motion tenderness no adnexal tenderness or masses. GC, chlamydia, and wet prep were obtained.  Labs:   All laboratory work was reviewed including any pertinent negatives or positives listed below:  Labs Reviewed  CHLAMYDIA/NGC RT PCR ([facility] ONLY)  WET PREP, GENITAL  GLUCOSE, CAPILLARY  URINALYSIS COMPLETEWITH MICROSCOPIC ([facility] ONLY)  Review of laboratory work shows a wet prep is positive for clue cells and white blood cells   ED Course: Patient's stay here was uneventful and she was informed of her lab results etc. I referred her to Dr. Valentino Saxonherry [facility]. She's been advised to call their office for an appointment as soon as possible. She was prescribed Flagyl for bacterial vaginosis.   Assessment:  Bacterial  vaginosis      Plan: * Outpatient management Please return immediately if condition worsens. Please contact her primary physician or the physician you were given for referral. If you have any specialist physicians involved in her treatment and plan please also contact them. Thank you for using [place] [facility] emergency [facility].            Jennye MoccasinBrian S Quigley, MD 10/12/15 [phone]

## 2015-10-12 NOTE — ED Notes (Signed)
Patient comes into the ED via POV c/o pelvic pain that is intermittent as well as bacterial infections that she has had chronically since her tubal ligation a year ago.  Denies any difficulty urinating, burning with urination, or itching.  Patient states she had a UTI a couple of weeks ago and completed her antibiotics completely.

## 2018-12-10 ENCOUNTER — Emergency Department: Payer: BC Managed Care – PPO

## 2018-12-10 ENCOUNTER — Emergency Department
Admission: EM | Admit: 2018-12-10 | Discharge: 2018-12-10 | Disposition: A | Discharge status: Home / Self Care | Payer: BC Managed Care – PPO | Attending: Emergency Medicine | Admitting: Emergency Medicine

## 2018-12-10 ENCOUNTER — Encounter: Payer: Self-pay | Admitting: *Deleted

## 2018-12-10 ENCOUNTER — Other Ambulatory Visit: Payer: Self-pay

## 2018-12-10 DIAGNOSIS — Y93E5 Activity, floor mopping and cleaning: Secondary | ICD-10-CM | POA: Diagnosis not present

## 2018-12-10 DIAGNOSIS — Y999 Unspecified external cause status: Secondary | ICD-10-CM | POA: Diagnosis not present

## 2018-12-10 DIAGNOSIS — W1789XA Other fall from one level to another, initial encounter: Secondary | ICD-10-CM | POA: Insufficient documentation

## 2018-12-10 DIAGNOSIS — Y9289 Other specified places as the place of occurrence of the external cause: Secondary | ICD-10-CM | POA: Diagnosis not present

## 2018-12-10 DIAGNOSIS — S0083XA Contusion of other part of head, initial encounter: Secondary | ICD-10-CM | POA: Diagnosis not present

## 2018-12-10 DIAGNOSIS — S0990XA Unspecified injury of head, initial encounter: Secondary | ICD-10-CM | POA: Diagnosis present

## 2018-12-10 DIAGNOSIS — S92522A Displaced fracture of medial phalanx of left lesser toe(s), initial encounter for closed fracture: Secondary | ICD-10-CM | POA: Insufficient documentation

## 2018-12-10 DIAGNOSIS — S161XXA Strain of muscle, fascia and tendon at neck level, initial encounter: Secondary | ICD-10-CM | POA: Insufficient documentation

## 2018-12-10 DIAGNOSIS — S62629A Displaced fracture of medial phalanx of unspecified finger, initial encounter for closed fracture: Secondary | ICD-10-CM

## 2018-12-10 MED ORDER — CYCLOBENZAPRINE HCL 10 MG PO TABS: 10.0000 mg | ORAL_TABLET | Freq: Three times a day (TID) | ORAL | 0 refills | Status: DC | PRN

## 2018-12-10 MED ORDER — IBUPROFEN 600 MG PO TABS: 600.0000 mg | ORAL_TABLET | Freq: Three times a day (TID) | ORAL | 0 refills | Status: DC | PRN

## 2018-12-10 MED ORDER — TRAMADOL HCL 50 MG PO TABS: 50.0000 mg | ORAL_TABLET | Freq: Two times a day (BID) | ORAL | 0 refills | Status: DC | PRN

## 2018-12-10 NOTE — ED Triage Notes (Signed)
Pt sent from UC after a fall from a chair while dusting ceiling fan. UC sent pt for imaging due to abrasion on right side of face and pain in neck. Pain in left pinky finger as well. Fall happened last night.

## 2018-12-10 NOTE — ED Provider Notes (Signed)
[facility] EMERGENCY DEPARTMENT Provider Note   CSN: [id number] Arrival date & time: 12/10/18  1200    History   Chief Complaint Chief Complaint  Patient presents with  . Fall    HPI Kathy HuntsmanManasha Michelle Sharp is a 43 y.o. female.     HPI Patient presents with facial abrasion/edema secondary to a fall.  Patient also complain of neck pain with lateral movements.  Patient state status post falls positive left fifth finger was dislocated and she self reduced deformity.  Incident occurred last night while she was standing on a sofa trying to clean a ceiling fan.  Patient denies loss of consciousness.  Patient denies vision disturbance.  Patient denies chest or abdominal pain.  Patient rates her pain as a 7/10.  Patient is taking Tylenol for her pain.  Patient was sent from urgent care clinic for definitive evaluation and treatment. Past Medical History:  Diagnosis Date  . Anemia   . Bacterial vaginosis     Patient Active Problem List   Diagnosis Date Noted  . NSVD (normal spontaneous vaginal delivery) 10/17/2014  . Encounter for sterilization Postpartum  10/17/2014  . Group B streptococcal bacteriuria 09/27/2014  . Abnormal ultrasonic finding on antenatal screening of mother 07/09/2014  . Supervision of other normal pregnancy 05/26/2014  . AMA (advanced maternal age) multigravida 35+ 05/26/2014    Past Surgical History:  Procedure Laterality Date  . TUBAL LIGATION Bilateral 10/17/2014   Procedure: POST PARTUM TUBAL LIGATION;  Surgeon: Tilda BurrowJohn Ferguson V, MD;  Location: [facility];  Service: Gynecology;  Laterality: Bilateral;     OB History    Gravida  3   Para  3   Term  3   Preterm      AB      Living  3     SAB      TAB      Ectopic      Multiple  0   Live Births  3            Home Medications    Prior to Admission medications   Medication Sig Start Date End Date Taking? Authorizing Provider  EPINEPHrine (EPI-PEN) 0.3 mg/0.3 mL DEVI Inject  0.3 mLs (0.3 mg total) into the muscle once. In event of severe allergic reaction. 02/12/12   Anders SimmondsMcClung, Angela M, PA-C  ibuprofen (ADVIL,MOTRIN) 600 MG tablet Take 1 tablet (600 mg total) by mouth every 6 (six) hours. 10/18/14   Montez MoritaLawson, Marie D, CNM  ondansetron (ZOFRAN) 8 MG tablet Take 1 tablet (8 mg total) by mouth every 8 (eight) hours as needed for nausea or vomiting. 08/25/15   Joni ReiningSmith, Hannan Hutmacher K, PA-C  oxyCODONE-acetaminophen (PERCOCET/ROXICET) 5-325 MG per tablet Take 1-2 tablets by mouth every 4 (four) hours as needed (for pain scale 4-7). 10/18/14   Katrinka BlazingSmith, [place], CNM  Prenatal Vit-Fe Fumarate-FA (PRENATAL MULTIVITAMIN) TABS tablet Take 1 tablet by mouth daily.    [provider]    Family History Family History  Problem Relation Age of Onset  . Cancer Mother     Social History Social History   Tobacco Use  . Smoking status: Never Smoker  . Smokeless tobacco: Never Used  Substance Use Topics  . Alcohol use: No    Comment: OCC  . Drug use: No     Allergies   Penicillins and Latex   Review of Systems Review of Systems   Physical Exam Updated Vital Signs BP 132/85 (BP Location: Left Arm)   Pulse  100   Temp 98 F (36.7 C) (Oral)   Resp 16   Ht 5\' 4"  (1.626 m)   Wt 56.2 kg   SpO2 100%   BMI 21.28 kg/m   Physical Exam HEENT is remarkable for facial edema.  There is also multiple abrasions the right lateral aspect of the face.  Patient has decreased range of motion with left lateral movements the neck.  Patient also has obvious edema to the fifth digit left hand.  ED Treatments / Results  Labs (all labs ordered are listed, but only abnormal results are displayed) Labs Reviewed - No data to display  EKG None  Radiology No results found.  Procedures Procedures (including critical care time)  Medications Ordered in ED Medications - No data to display   Initial Impression / Assessment and Plan / ED Course  I have reviewed the triage vital signs  and the nursing notes.  Pertinent labs & imaging results that were available during my care of the patient were reviewed by me and considered in my medical decision making (see chart for details).        Patient presents with facial and neck pain secondary to a fall.  Patient also has edema to the fifth digit of the left hand.  Differential diagnosis consists of facial fracture, cervical strain, and fracture finger.  Final Clinical Impressions(s) / ED Diagnoses   Final diagnoses:  None  Facial contusion/abrasion, cervical strain, and avulsion fracture left fifth finger.  ED Discharge Orders    None       Hewitt Blade 12/11/18 9150    Arta Silence, MD 12/13/18 [phone]

## 2018-12-10 NOTE — Discharge Instructions (Signed)
Follow discharge care instruction take medication as directed.  May return to work in 3 days.

## 2020-01-19 ENCOUNTER — Other Ambulatory Visit: Payer: Self-pay | Admitting: Family Medicine

## 2020-01-19 ENCOUNTER — Encounter: Payer: Self-pay | Admitting: Family Medicine

## 2020-01-19 ENCOUNTER — Ambulatory Visit: Payer: BC Managed Care – PPO | Admitting: Family Medicine

## 2020-01-19 ENCOUNTER — Other Ambulatory Visit: Payer: Self-pay

## 2020-01-19 DIAGNOSIS — B9689 Other specified bacterial agents as the cause of diseases classified elsewhere: Secondary | ICD-10-CM

## 2020-01-19 DIAGNOSIS — Z113 Encounter for screening for infections with a predominantly sexual mode of transmission: Secondary | ICD-10-CM

## 2020-01-19 DIAGNOSIS — N760 Acute vaginitis: Secondary | ICD-10-CM

## 2020-01-19 LAB — WET PREP FOR TRICH, YEAST, CLUE
Trichomonas Exam: NEGATIVE
Yeast Exam: NEGATIVE

## 2020-01-19 MED ORDER — METRONIDAZOLE 500 MG PO TABS: 500.0000 mg | ORAL_TABLET | Freq: Two times a day (BID) | ORAL | 0 refills | Status: AC

## 2020-01-19 NOTE — Progress Notes (Signed)
[facility] Department STI clinic/screening visit  Subjective:  Kathy Sharp is a 44 y.o. female being seen today for an STI screening visit. The patient reports they do have symptoms.  Patient reports that they do not desire a pregnancy in the next year.   They reported they are not interested in discussing contraception today.  Patient's last menstrual period was 01/17/2020.  Client has a BTL   Patient has the following medical conditions:   Patient Active Problem List   Diagnosis Date Noted  . NSVD (normal spontaneous vaginal delivery) 10/17/2014  . Encounter for sterilization Postpartum  10/17/2014  . Group B streptococcal bacteriuria 09/27/2014  . Abnormal ultrasonic finding on antenatal screening of mother 07/09/2014  . Supervision of other normal pregnancy 05/26/2014  . AMA (advanced maternal age) multigravida 35+ 05/26/2014    No chief complaint on file.   HPI  Patient reports she has had a increase in white discharge with a strong odor X 1 week.  States that she has a h/o BV.  Last HIV test per patient/review of record was 2016 Patient reports last pap was 2016 ?   See flowsheet for further details and programmatic requirements.    The following portions of the patient's history were reviewed and updated as appropriate: allergies, current medications, past medical history, past social history, past surgical history and problem list.  Objective:  There were no vitals filed for this visit.  Physical Exam Vitals and nursing note reviewed.  Constitutional:      Appearance: Normal appearance.  HENT:     Head: Normocephalic and atraumatic.     Mouth/Throat:     Mouth: Mucous membranes are moist.     Pharynx: Oropharynx is clear. No oropharyngeal exudate or posterior oropharyngeal erythema.  Pulmonary:     Effort: Pulmonary effort is normal.  Abdominal:     General: Abdomen is flat.     Palpations: There is no mass.     Tenderness: There is no  abdominal tenderness. There is no rebound.  Genitourinary:    General: Normal vulva.     Exam position: Lithotomy position.     Pubic Area: No rash or pubic lice.      Labia:        Right: No rash or lesion.        Left: No rash or lesion.      Vagina: Vaginal discharge present. No erythema, bleeding or lesions.     Cervix: No cervical motion tenderness, discharge, friability, lesion or erythema.     Uterus: Normal.      Adnexa: Right adnexa normal and left adnexa normal.     Rectum: Normal.     Comments: Thin white disch adhering to vaginal walls, pH>4.5, fishy odor noted Lymphadenopathy:     Head:     Right side of head: No preauricular or posterior auricular adenopathy.     Left side of head: No preauricular or posterior auricular adenopathy.     Cervical: No cervical adenopathy.     Upper Body:     Right upper body: No supraclavicular or axillary adenopathy.     Left upper body: No supraclavicular or axillary adenopathy.     Lower Body: No right inguinal adenopathy. No left inguinal adenopathy.  Skin:    General: Skin is warm and dry.     Findings: No rash.  Neurological:     Mental Status: She is alert and oriented to person, place, and time.  Assessment and Plan:  Kathy Sharp is a 44 y.o. female presenting to the [facility] Department for STI screening  1. Screening examination for venereal disease  - WET PREP FOR TRICH, YEAST, CLUE - Chlamydia/Gonorrhea [place] [facility] Treat wet prep as per SO  2. Bacterial Vaginosis Treat with Metronidazole 500 mg po BID x 7 days. Co. No sexual activty x 7 days.  No follow-ups on file.  No future appointments.  Larene Pickett, FNP

## 2020-01-19 NOTE — Progress Notes (Signed)
Wet mount reviewed, patient treated for BV per SO..Treyton Slimp Brewer-Jensen, RN  

## 2020-01-24 LAB — GONOCOCCUS CULTURE

## 2020-01-26 NOTE — Addendum Note (Signed)
Addended by: Heywood Bene on: 01/26/2020 02:32 PM   Modules accepted: Orders

## 2020-01-28 ENCOUNTER — Telehealth: Payer: Self-pay | Admitting: Family Medicine

## 2020-01-28 NOTE — Telephone Encounter (Signed)
After client correctly provided password and last four numbers of Social Security Number, available test results provided. Client counseled that gonorrhea / chlamydia culture sent to state lab had results that are not yet available and to call back if a few days for those results if desired. Jossie Ng, RN

## 2020-01-28 NOTE — Telephone Encounter (Signed)
Pls call for test results, if I dont answer leave a message

## 2020-04-05 ENCOUNTER — Other Ambulatory Visit: Payer: Self-pay

## 2020-04-05 ENCOUNTER — Ambulatory Visit
Admission: EM | Admit: 2020-04-05 | Discharge: 2020-04-05 | Disposition: A | Discharge status: Home / Self Care | Payer: BC Managed Care – PPO | Attending: Emergency Medicine | Admitting: Emergency Medicine

## 2020-04-05 DIAGNOSIS — J029 Acute pharyngitis, unspecified: Secondary | ICD-10-CM | POA: Insufficient documentation

## 2020-04-05 LAB — POCT RAPID STREP A (OFFICE): Rapid Strep A Screen: NEGATIVE

## 2020-04-05 MED ORDER — AZITHROMYCIN 250 MG PO TABS: 250.0000 mg | ORAL_TABLET | Freq: Every day | ORAL | 0 refills | Status: DC

## 2020-04-05 NOTE — Discharge Instructions (Signed)
Take the Zithromax as directed.  Follow up with your primary care provider if your symptoms are not improving.    

## 2020-04-05 NOTE — ED Provider Notes (Signed)
[name]    CSN: [id number] Arrival date & time: 04/05/20  1710      History   Chief Complaint Chief Complaint  Patient presents with  . Sore Throat  . Exposure to Strep    HPI Kathy Sharp is a 44 y.o. female.   Patient presents with sore throat since this morning.  Her son tested positive for strep throat today.  She denies fever, rash, cough, shortness of breath, vomiting, diarrhea, or other symptoms.  No treatments attempted at home.  The history is provided by the patient.    Past Medical History:  Diagnosis Date  . Anemia   . Bacterial vaginosis     Patient Active Problem List   Diagnosis Date Noted  . NSVD (normal spontaneous vaginal delivery) 10/17/2014  . Encounter for sterilization Postpartum  10/17/2014  . Group B streptococcal bacteriuria 09/27/2014  . Abnormal ultrasonic finding on antenatal screening of mother 07/09/2014  . Supervision of other normal pregnancy 05/26/2014  . AMA (advanced maternal age) multigravida 35+ 05/26/2014    Past Surgical History:  Procedure Laterality Date  . TUBAL LIGATION Bilateral 10/17/2014   Procedure: POST PARTUM TUBAL LIGATION;  Surgeon: Tilda Burrow, MD;  Location: [facility];  Service: Gynecology;  Laterality: Bilateral;    OB History    Gravida  3   Para  3   Term  3   Preterm      AB      Living  3     SAB      TAB      Ectopic      Multiple  0   Live Births  3            Home Medications    Prior to Admission medications   Medication Sig Start Date End Date Taking? Authorizing Provider  azithromycin (ZITHROMAX) 250 MG tablet Take 1 tablet (250 mg total) by mouth daily. Take first 2 tablets together, then 1 every day until finished. 04/05/20   Mickie Bail, NP  cyclobenzaprine (FLEXERIL) 10 MG tablet Take 1 tablet (10 mg total) by mouth 3 (three) times daily as needed. 12/10/18   Joni Reining, PA-C  EPINEPHrine (EPI-PEN) 0.3 mg/0.3 mL DEVI Inject 0.3 mLs (0.3 mg total) into  the muscle once. In event of severe allergic reaction. 02/12/12   Anders Simmonds, PA-C  ibuprofen (ADVIL) 600 MG tablet Take 1 tablet (600 mg total) by mouth every 8 (eight) hours as needed. 12/10/18   Joni Reining, PA-C  ibuprofen (ADVIL,MOTRIN) 600 MG tablet Take 1 tablet (600 mg total) by mouth every 6 (six) hours. 10/18/14   Montez Morita, CNM  ondansetron (ZOFRAN) 8 MG tablet Take 1 tablet (8 mg total) by mouth every 8 (eight) hours as needed for nausea or vomiting. 08/25/15   Joni Reining, PA-C  oxyCODONE-acetaminophen (PERCOCET/ROXICET) 5-325 MG per tablet Take 1-2 tablets by mouth every 4 (four) hours as needed (for pain scale 4-7). 10/18/14   Katrinka Blazing, [place], CNM  Prenatal Vit-Fe Fumarate-FA (PRENATAL MULTIVITAMIN) TABS tablet Take 1 tablet by mouth daily.    [provider]  traMADol (ULTRAM) 50 MG tablet Take 1 tablet (50 mg total) by mouth every 12 (twelve) hours as needed. 12/10/18   Joni Reining, PA-C    Family History Family History  Problem Relation Age of Onset  . Cancer Mother     Social History Social History   Tobacco Use  . Smoking status:  Never Smoker  . Smokeless tobacco: Never Used  Substance Use Topics  . Alcohol use: No    Comment: OCC  . Drug use: No     Allergies   Penicillins and Latex   Review of Systems Review of Systems  Constitutional: Negative for chills and fever.  HENT: Positive for sore throat. Negative for ear pain.   Eyes: Negative for pain and visual disturbance.  Respiratory: Negative for cough and shortness of breath.   Cardiovascular: Negative for chest pain and palpitations.  Gastrointestinal: Negative for abdominal pain, diarrhea and vomiting.  Genitourinary: Negative for dysuria and hematuria.  Musculoskeletal: Negative for arthralgias and back pain.  Skin: Negative for color change and rash.  Neurological: Negative for seizures and syncope.  All other systems reviewed and are negative.    Physical  Exam Triage Vital Signs ED Triage Vitals  Enc Vitals Group     BP 04/05/20 1720 116/79     Pulse Rate 04/05/20 1720 70     Resp 04/05/20 1720 16     Temp 04/05/20 1720 99.1 F (37.3 C)     Temp src --      SpO2 04/05/20 1720 99 %     Weight --      Height --      Head Circumference --      Peak Flow --      Pain Score 04/05/20 1718 3     Pain Loc --      Pain Edu? --      Excl. in GC? --    No data found.  Updated Vital Signs BP 116/79   Pulse 70   Temp 99.1 F (37.3 C)   Resp 16   LMP 04/03/2020 (Exact Date)   SpO2 99%   Visual Acuity Right Eye Distance:   Left Eye Distance:   Bilateral Distance:    Right Eye Near:   Left Eye Near:    Bilateral Near:     Physical Exam Vitals and nursing note reviewed.  Constitutional:      General: She is not in acute distress.    Appearance: She is well-developed.  HENT:     Head: Normocephalic and atraumatic.     Right Ear: Tympanic membrane normal.     Left Ear: Tympanic membrane normal.     Mouth/Throat:     Mouth: Mucous membranes are moist.     Pharynx: Posterior oropharyngeal erythema present.  Eyes:     Conjunctiva/sclera: Conjunctivae normal.  Cardiovascular:     Rate and Rhythm: Normal rate and regular rhythm.     Heart sounds: No murmur heard.   Pulmonary:     Effort: Pulmonary effort is normal. No respiratory distress.     Breath sounds: Normal breath sounds.  Abdominal:     Palpations: Abdomen is soft.     Tenderness: There is no abdominal tenderness.  Musculoskeletal:     Cervical back: Neck supple.  Skin:    General: Skin is warm and dry.     Findings: No rash.  Neurological:     General: No focal deficit present.     Mental Status: She is alert and oriented to person, place, and time.     Gait: Gait normal.  Psychiatric:        Mood and Affect: Mood normal.        Behavior: Behavior normal.      UC Treatments / Results  Labs (all labs ordered are listed, but only  abnormal results are  displayed) Labs Reviewed  CULTURE, GROUP A STREP [facility])  POCT RAPID STREP A (OFFICE)    EKG   Radiology No results found.  Procedures Procedures (including critical care time)  Medications Ordered in UC Medications - No data to display  Initial Impression / Assessment and Plan / UC Course  I have reviewed the triage vital signs and the nursing notes.  Pertinent labs & imaging results that were available during my care of the patient were reviewed by me and considered in my medical decision making (see chart for details).   Sore throat.  Rapid strep negative; culture pending.  Patient is symptomatic and her son tested positive for strep today; treating with Zithromax.  Instructed her to take Tylenol or ibuprofen as needed for fever or discomfort.  Instructed her to follow-up with her PCP if her symptoms are not improving.  Patient agrees to plan of care.     Final Clinical Impressions(s) / UC Diagnoses   Final diagnoses:  Sore throat     Discharge Instructions     Take the Zithromax as directed.    Follow up with your primary care provider if your symptoms are not improving.       ED Prescriptions    Medication Sig Dispense Auth. Provider   azithromycin (ZITHROMAX) 250 MG tablet Take 1 tablet (250 mg total) by mouth daily. Take first 2 tablets together, then 1 every day until finished. 6 tablet Mickie Bail, NP     PDMP not reviewed this encounter.   Mickie Bail, NP 04/05/20 [phone]

## 2020-04-05 NOTE — ED Triage Notes (Signed)
Patient was here earlier today with her son who tested positive for strep. Patient reports that she noticed a sore throat this morning, but decided to wait and see if it would go away throughout the day. Patient reports that her son has been eating/drinking after her the past few days.

## 2020-04-08 LAB — CULTURE, GROUP A STREP (THRC)

## 2020-06-25 IMAGING — CT CT HEAD WITHOUT CONTRAST
4 of 10 series · 16 of 47 positions shown, 18 images · non-contrast
Comparison: None.

CLINICAL DATA: Fall, facial injury

EXAM:
CT HEAD WITHOUT CONTRAST
CT MAXILLOFACIAL WITHOUT CONTRAST
CT CERVICAL SPINE WITHOUT CONTRAST
TECHNIQUE: Multidetector CT imaging of the head, cervical spine, and
maxillofacial structures were performed using the standard protocol
without intravenous contrast. Multiplanar CT image reconstructions
of the cervical spine and maxillofacial structures were also
generated.

[Series 9: sagittal bone · sagittal · 0.29mm/px · 1 of 62 slices shown]
[im 31/62  brain]
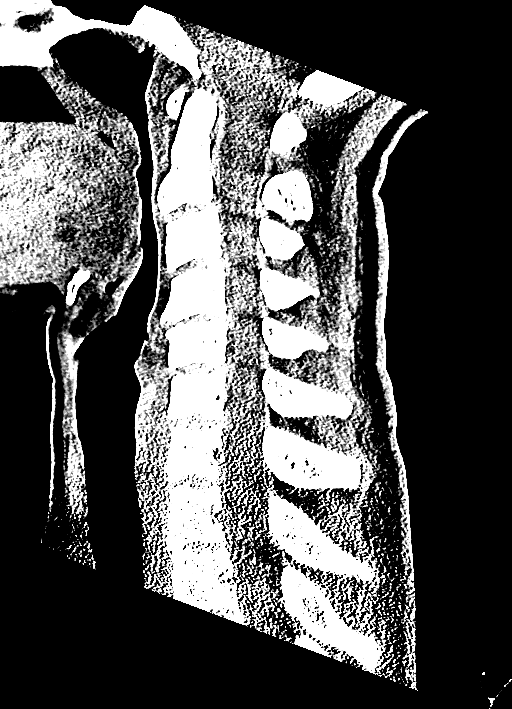

[Series 11: orthogonal axials · axial · 0.27mm/px · z∈[-316,-177]mm · 8 of 94 slices shown, 10 images]
[im 11/94  brain]
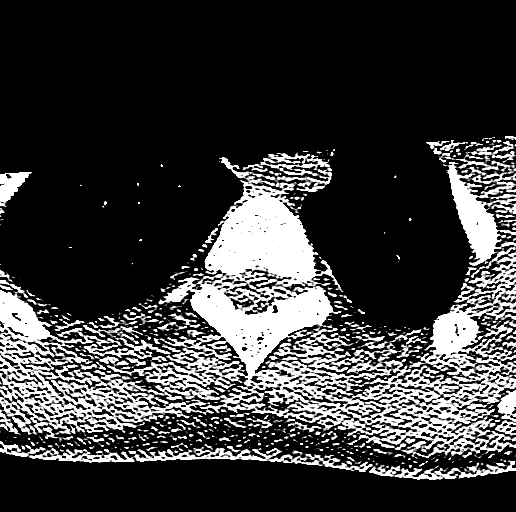
[im 11/94  bone]
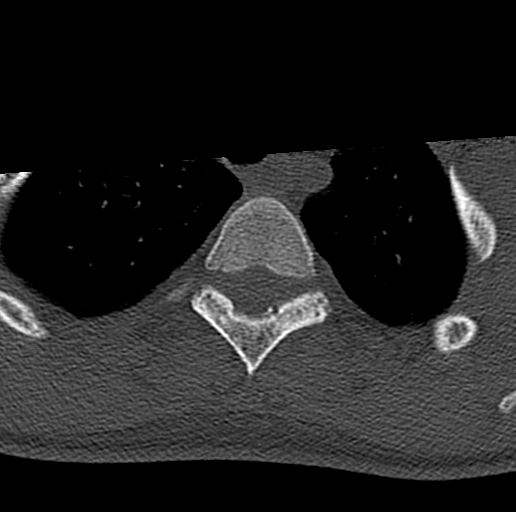
[im 21/94  brain]
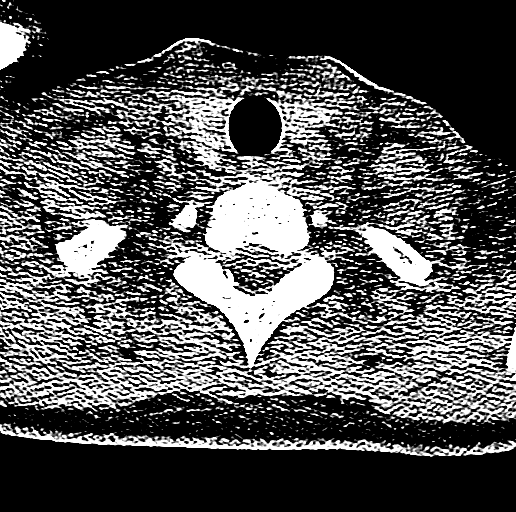
[im 32/94  brain]
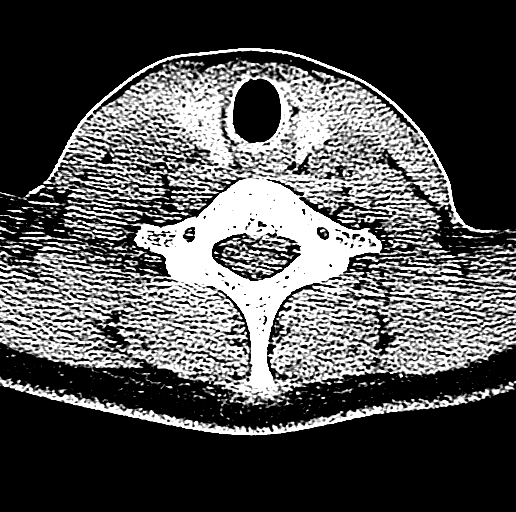
[im 42/94  brain]
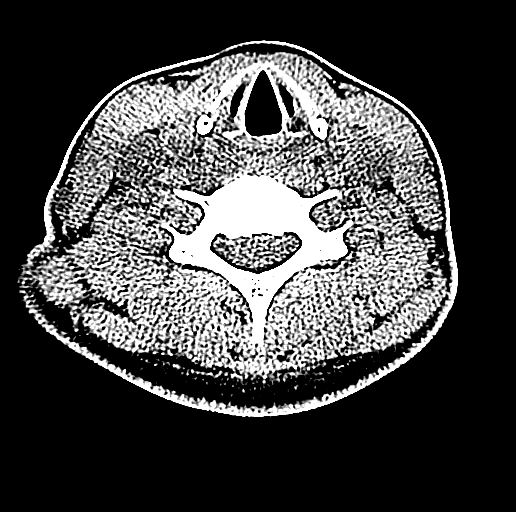
[im 52/94  brain]
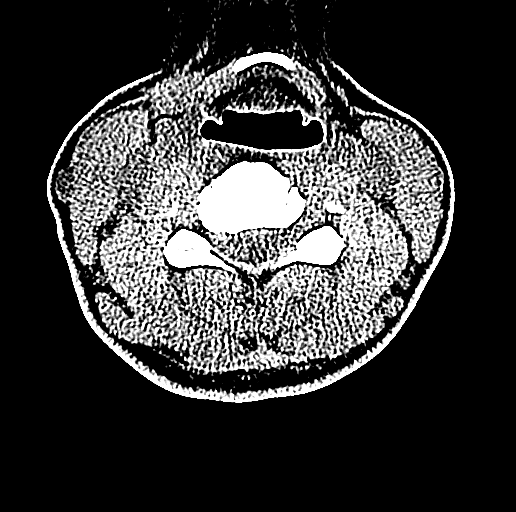
[im 52/94  bone]
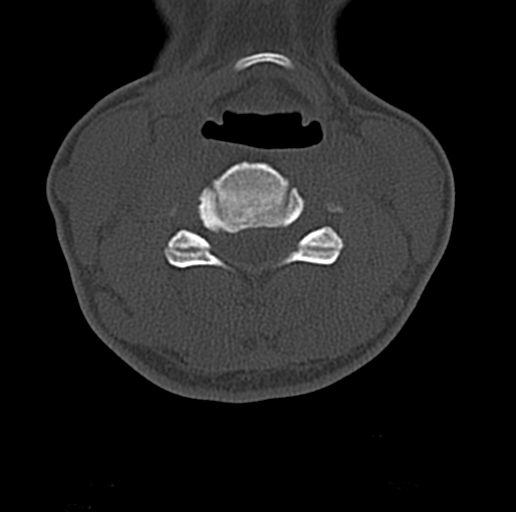
[im 63/94  brain]
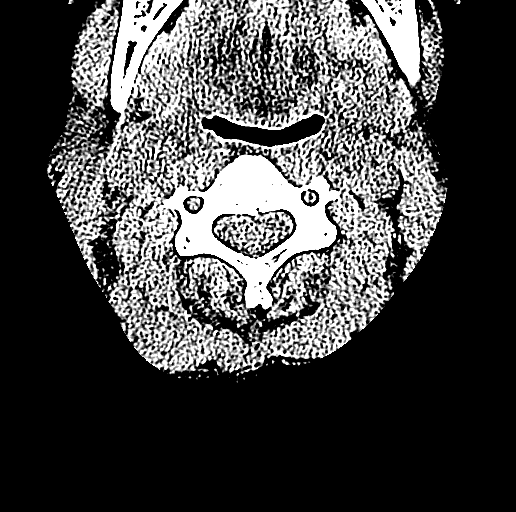
[im 73/94  brain]
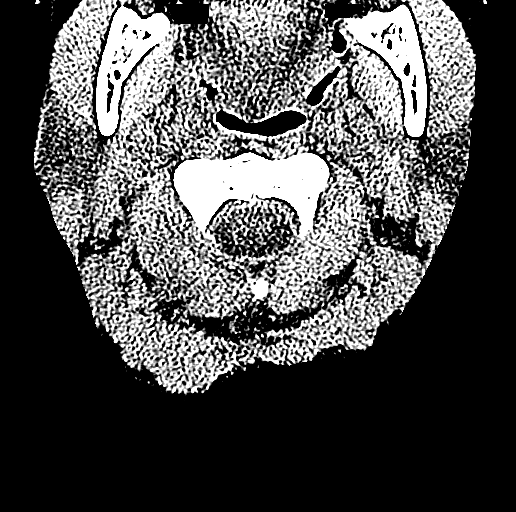
[im 83/94  brain]
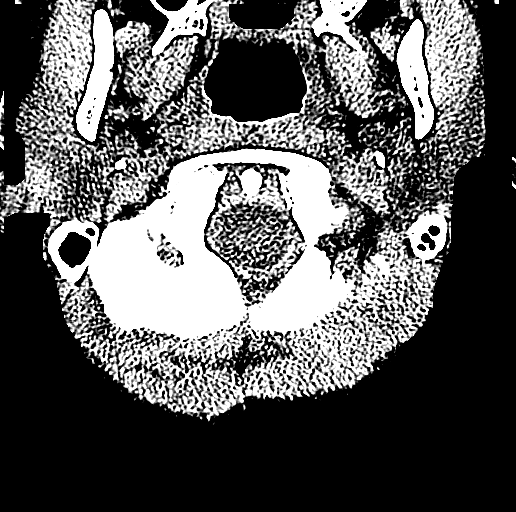

[Series 12: max soft · axial · 0.29mm/px · z∈[-238,-178]mm · 4 of 80 slices shown]
[im 10/80  brain]
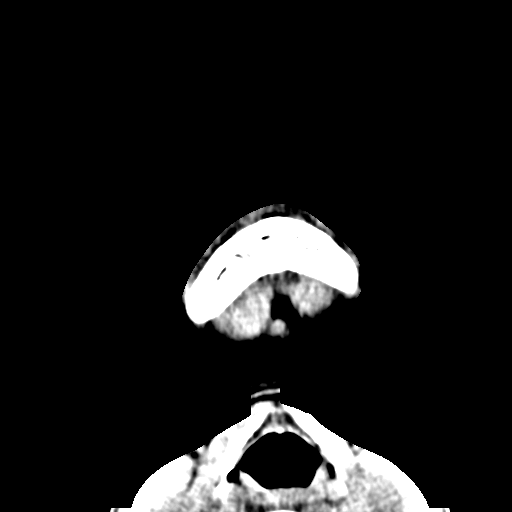
[im 20/80  brain]
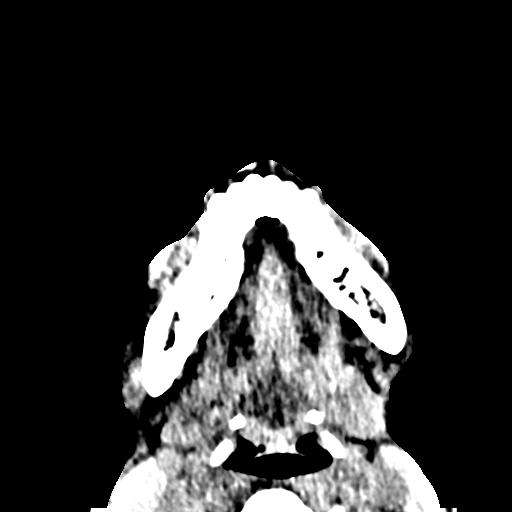
[im 30/80  brain]
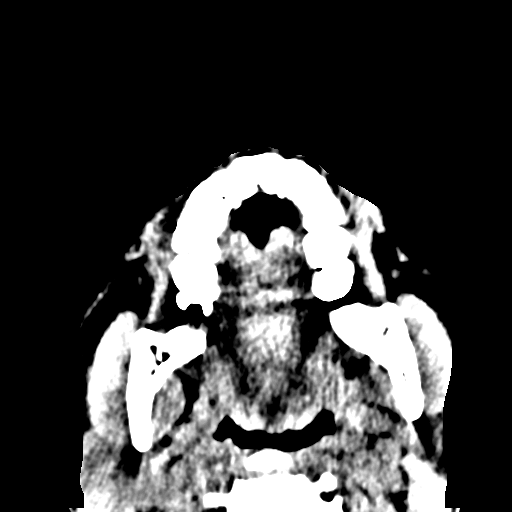
[im 40/80  brain]
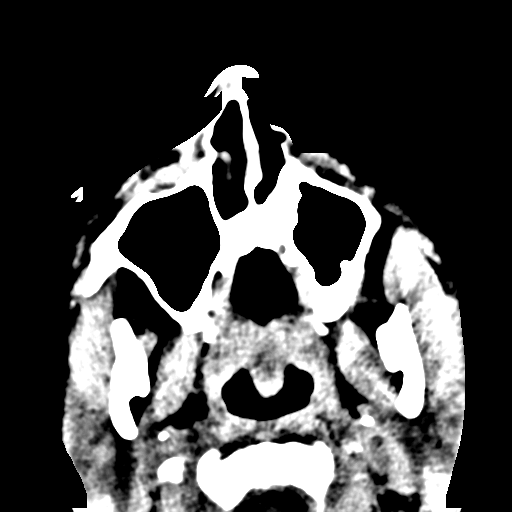

[Series 16: coronal soft · coronal · 0.33mm/px · 3 of 77 slices shown]
[im 20/77  brain]
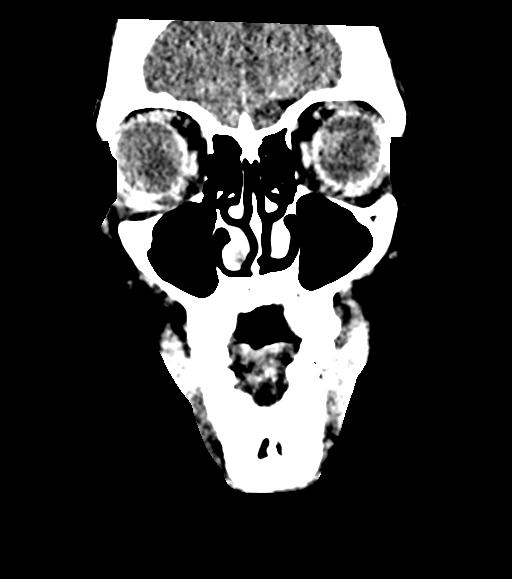
[im 39/77  brain]
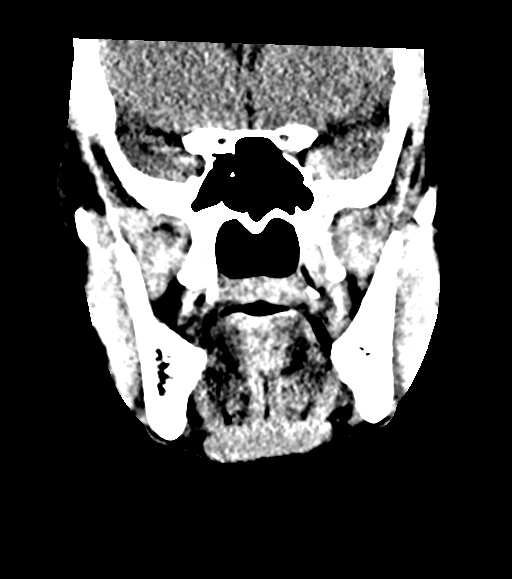
[im 58/77  brain]
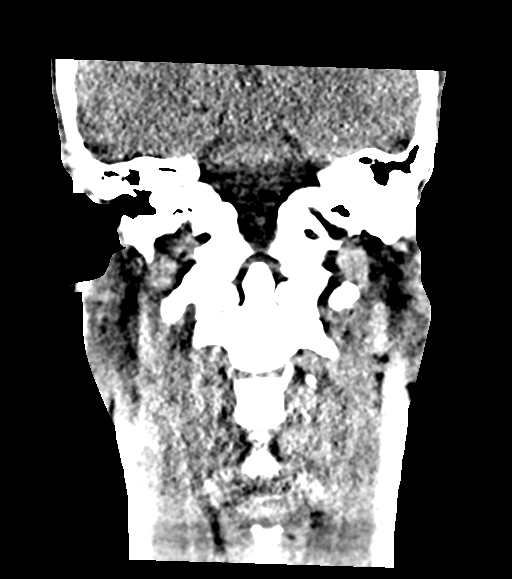

[16 of 47 positions shown; findings below may reference images not displayed]

FINDINGS: CT HEAD FINDINGS

Brain: No evidence of acute infarction, hemorrhage, hydrocephalus,
extra-axial collection or mass lesion/mass effect.

Vascular: No hyperdense vessel or unexpected calcification.

CT FACIAL BONES FINDINGS

Skull: Normal. Negative for fracture or focal lesion.

Facial bones: No displaced fractures or dislocations.

Sinuses/Orbits: No acute finding.

Other: Soft tissue edema over the right cheek.

CT CERVICAL SPINE FINDINGS

Alignment: Normal.

Skull base and vertebrae: No acute fracture. No primary bone lesion
or focal pathologic process.

Soft tissues and spinal canal: No prevertebral fluid or swelling. No
visible canal hematoma.

Disc levels:  Intact.

Upper chest: Negative.

Other: None.
IMPRESSION: 1.  No acute intracranial pathology.

2. No displaced fracture or dislocation of the facial bones. Soft
tissue edema over the right cheek.

3.  No fracture or static subluxation of the cervical spine.

## 2020-08-30 ENCOUNTER — Other Ambulatory Visit: Payer: Self-pay

## 2020-08-30 ENCOUNTER — Ambulatory Visit
Admission: EM | Admit: 2020-08-30 | Discharge: 2020-08-30 | Disposition: A | Discharge status: Home / Self Care | Payer: 59 | Attending: Family Medicine | Admitting: Family Medicine

## 2020-08-30 DIAGNOSIS — N898 Other specified noninflammatory disorders of vagina: Secondary | ICD-10-CM | POA: Diagnosis not present

## 2020-08-30 MED ORDER — METRONIDAZOLE 500 MG PO TABS: 500.0000 mg | ORAL_TABLET | Freq: Two times a day (BID) | ORAL | 0 refills | Status: DC

## 2020-08-30 NOTE — Discharge Instructions (Addendum)
Treating you for BV Swab sent for testing.  Follow up as needed for continued or worsening symptoms

## 2020-08-30 NOTE — ED Provider Notes (Signed)
[name]    CSN: [id number] Arrival date & time: 08/30/20  1024      History   Chief Complaint Chief Complaint  Patient presents with  . Vaginal Discharge    HPI Kathy Sharp is a 45 y.o. female.   Pt is a 45 year old female that presents with malodorous vaginal discharge off-white in color for approx 2.5 weeks. Pt states h/o BV. Denies abdominal pain, n/v/d, fever, dysuria sx. No OTC remedies used     Past Medical History:  Diagnosis Date  . Anemia   . Bacterial vaginosis     Patient Active Problem List   Diagnosis Date Noted  . NSVD (normal spontaneous vaginal delivery) 10/17/2014  . Encounter for sterilization Postpartum  10/17/2014  . Group B streptococcal bacteriuria 09/27/2014  . Abnormal ultrasonic finding on antenatal screening of mother 07/09/2014  . Supervision of other normal pregnancy 05/26/2014  . AMA (advanced maternal age) multigravida 35+ 05/26/2014    Past Surgical History:  Procedure Laterality Date  . TUBAL LIGATION Bilateral 10/17/2014   Procedure: POST PARTUM TUBAL LIGATION;  Surgeon: Tilda Burrow, MD;  Location: [facility];  Service: Gynecology;  Laterality: Bilateral;    OB History    Gravida  3   Para  3   Term  3   Preterm      AB      Living  3     SAB      IAB      Ectopic      Multiple  0   Live Births  3            Home Medications    Prior to Admission medications   Medication Sig Start Date End Date Taking? Authorizing Provider  metroNIDAZOLE (FLAGYL) 500 MG tablet Take 1 tablet (500 mg total) by mouth 2 (two) times daily. 08/30/20  Yes Janace Aris, NP    Family History Family History  Problem Relation Age of Onset  . Cancer Mother     Social History Social History   Tobacco Use  . Smoking status: Never Smoker  . Smokeless tobacco: Never Used  Substance Use Topics  . Alcohol use: No    Comment: OCC  . Drug use: No     Allergies   Penicillins and Latex   Review of  Systems Review of Systems   Physical Exam Triage Vital Signs ED Triage Vitals  Enc Vitals Group     BP 08/30/20 1049 112/74     Pulse Rate 08/30/20 1049 75     Resp 08/30/20 1049 16     Temp 08/30/20 1049 98.9 F (37.2 C)     Temp Source 08/30/20 1049 Oral     SpO2 08/30/20 1049 98 %     Weight --      Height --      Head Circumference --      Peak Flow --      Pain Score 08/30/20 1046 0     Pain Loc --      Pain Edu? --      Excl. in GC? --    No data found.  Updated Vital Signs BP 112/74 (BP Location: Left Arm)   Pulse 75   Temp 98.9 F (37.2 C) (Oral)   Resp 16   LMP 08/18/2020 (Approximate)   SpO2 98%   Visual Acuity Right Eye Distance:   Left Eye Distance:   Bilateral Distance:    Right  Eye Near:   Left Eye Near:    Bilateral Near:     Physical Exam Vitals and nursing note reviewed.  Constitutional:      General: She is not in acute distress.    Appearance: Normal appearance. She is not ill-appearing, toxic-appearing or diaphoretic.  HENT:     Head: Normocephalic.  Eyes:     Conjunctiva/sclera: Conjunctivae normal.  Pulmonary:     Effort: Pulmonary effort is normal.  Musculoskeletal:        General: Normal range of motion.     Cervical back: Normal range of motion.  Skin:    General: Skin is warm and dry.     Findings: No rash.  Neurological:     Mental Status: She is alert.  Psychiatric:        Mood and Affect: Mood normal.      UC Treatments / Results  Labs (all labs ordered are listed, but only abnormal results are displayed) Labs Reviewed  CERVICOVAGINAL ANCILLARY ONLY    EKG   Radiology No results found.  Procedures Procedures (including critical care time)  Medications Ordered in UC Medications - No data to display  Initial Impression / Assessment and Plan / UC Course  I have reviewed the triage vital signs and the nursing notes.  Pertinent labs & imaging results that were available during my care of the patient  were reviewed by me and considered in my medical decision making (see chart for details).     Vaginal discharge Most likely BV based on symptoms and history.  Will treat for BV at this time.  Sending swab for testing.  Final Clinical Impressions(s) / UC Diagnoses   Final diagnoses:  Vaginal discharge     Discharge Instructions     Treating you for BV Swab sent for testing.  Follow up as needed for continued or worsening symptoms    ED Prescriptions    Medication Sig Dispense Auth. Provider   metroNIDAZOLE (FLAGYL) 500 MG tablet Take 1 tablet (500 mg total) by mouth 2 (two) times daily. 14 tablet Arlind Klingerman A, NP     PDMP not reviewed this encounter.   Dahlia Byes A, NP 08/30/20 1140

## 2020-08-30 NOTE — ED Triage Notes (Signed)
Pt c/o malodorous vaginal discharge off-white in color for approx 2.5 weeks. Pt states h/o BV.   Denies abdominal pain, n/v/d, fever, dysuria sx. No OTC remedies used

## 2020-08-31 LAB — CERVICOVAGINAL ANCILLARY ONLY
Bacterial Vaginitis (gardnerella): POSITIVE — AB
Candida Glabrata: NEGATIVE
Candida Vaginitis: NEGATIVE
Chlamydia: NEGATIVE
Comment: NEGATIVE
Comment: NEGATIVE
Comment: NEGATIVE
Comment: NEGATIVE
Comment: NEGATIVE
Comment: NORMAL
Neisseria Gonorrhea: NEGATIVE
Trichomonas: NEGATIVE

## 2021-02-28 ENCOUNTER — Ambulatory Visit
Admission: EM | Admit: 2021-02-28 | Discharge: 2021-02-28 | Disposition: A | Discharge status: Home / Self Care | Payer: 59 | Attending: Internal Medicine | Admitting: Internal Medicine

## 2021-02-28 ENCOUNTER — Encounter: Payer: Self-pay | Admitting: Emergency Medicine

## 2021-02-28 ENCOUNTER — Other Ambulatory Visit: Payer: Self-pay

## 2021-02-28 DIAGNOSIS — B9689 Other specified bacterial agents as the cause of diseases classified elsewhere: Secondary | ICD-10-CM

## 2021-02-28 DIAGNOSIS — N760 Acute vaginitis: Secondary | ICD-10-CM

## 2021-02-28 MED ORDER — METRONIDAZOLE 500 MG PO TABS: 500.0000 mg | ORAL_TABLET | Freq: Two times a day (BID) | ORAL | 0 refills | Status: DC

## 2021-02-28 NOTE — ED Provider Notes (Signed)
[name]    CSN: [id number] Arrival date & time: 02/28/21  1410      History   Chief Complaint Chief Complaint  Patient presents with   Vaginal Discharge    HPI Kathy Sharp is a 45 y.o. female who presents with vaginal odor after ending her period 2  days ago. But denies using tampons. She is prone to BV after  her periods though has not had one in 3 months. She could not get into her GYN til October. Denies discharge or itching.     Past Medical History:  Diagnosis Date   Anemia    Bacterial vaginosis     Patient Active Problem List   Diagnosis Date Noted   NSVD (normal spontaneous vaginal delivery) 10/17/2014   Encounter for sterilization Postpartum  10/17/2014   Group B streptococcal bacteriuria 09/27/2014   Abnormal ultrasonic finding on antenatal screening of mother 07/09/2014   Supervision of other normal pregnancy 05/26/2014   AMA (advanced maternal age) multigravida 35+ 05/26/2014    Past Surgical History:  Procedure Laterality Date   TUBAL LIGATION Bilateral 10/17/2014   Procedure: POST PARTUM TUBAL LIGATION;  Surgeon: Tilda Burrow, MD;  Location: [facility];  Service: Gynecology;  Laterality: Bilateral;    OB History     Gravida  3   Para  3   Term  3   Preterm      AB      Living  3      SAB      IAB      Ectopic      Multiple  0   Live Births  3            Home Medications    Prior to Admission medications   Medication Sig Start Date End Date Taking? Authorizing Provider  metroNIDAZOLE (FLAGYL) 500 MG tablet Take 1 tablet (500 mg total) by mouth 2 (two) times daily. 02/28/21  Yes Rodriguez-Southworth, Nettie Elm, PA-C    Family History Family History  Problem Relation Age of Onset   Cancer Mother     Social History Social History   Tobacco Use   Smoking status: Never   Smokeless tobacco: Never  Vaping Use   Vaping Use: Never used  Substance Use Topics   Alcohol use: No    Comment: OCC   Drug use: No      Allergies   Penicillins and Latex   Review of Systems Review of Systems  Gastrointestinal:  Negative for abdominal pain.  Genitourinary:  Negative for dysuria, frequency, genital sores, urgency, vaginal discharge and vaginal pain.       + vaginal odor  Skin:  Negative for rash.  Hematological:  Negative for adenopathy.    Physical Exam Triage Vital Signs ED Triage Vitals  Enc Vitals Group     BP 02/28/21 1432 102/62     Pulse Rate 02/28/21 1432 62     Resp 02/28/21 1432 18     Temp 02/28/21 1432 99.1 F (37.3 C)     Temp Source 02/28/21 1432 Oral     SpO2 02/28/21 1432 100 %     Weight --      Height --      Head Circumference --      Peak Flow --      Pain Score 02/28/21 1428 1     Pain Loc --      Pain Edu? --      Excl. in GC? --  No data found.  Updated Vital Signs BP 102/62 (BP Location: Left Arm)   Pulse 62   Temp 99.1 F (37.3 C) (Oral)   Resp 18   LMP 02/23/2021   SpO2 100%   Visual Acuity Right Eye Distance:   Left Eye Distance:   Bilateral Distance:    Right Eye Near:   Left Eye Near:    Bilateral Near:     Physical Exam Vitals and nursing note reviewed.  Constitutional:      General: She is not in acute distress.    Appearance: She is normal weight. She is not toxic-appearing.  HENT:     Head: Normocephalic.     Right Ear: External ear normal.     Left Ear: External ear normal.  Eyes:     General: No scleral icterus.    Conjunctiva/sclera: Conjunctivae normal.  Pulmonary:     Effort: Pulmonary effort is normal.  Musculoskeletal:        General: Normal range of motion.     Cervical back: Neck supple.  Neurological:     Mental Status: She is alert and oriented to person, place, and time.     Gait: Gait normal.  Psychiatric:        Mood and Affect: Mood normal.        Behavior: Behavior normal.        Thought Content: Thought content normal.        Judgment: Judgment normal.     UC Treatments / Results  Labs (all  labs ordered are listed, but only abnormal results are displayed) Labs Reviewed - No data to display  EKG   Radiology No results found.  Procedures Procedures (including critical care time)  Medications Ordered in UC Medications - No data to display  Initial Impression / Assessment and Plan / UC Course  I have reviewed the triage vital signs and the nursing notes. Recurrent BV. I placed her on Flagyl pills since she cant use the gel due to past reaction from it.  Testing not ordered since we have to sent out test and pt recognizes her symptoms.      Final Clinical Impressions(s) / UC Diagnoses   Final diagnoses:  Bacterial vaginitis   Discharge Instructions   None    ED Prescriptions     Medication Sig Dispense Auth. Provider   metroNIDAZOLE (FLAGYL) 500 MG tablet Take 1 tablet (500 mg total) by mouth 2 (two) times daily. 14 tablet Rodriguez-Southworth, Nettie Elm, PA-C      PDMP not reviewed this encounter.   Garey Ham, PA-C 02/28/21 1449

## 2021-02-28 NOTE — ED Triage Notes (Signed)
Reports an odor after her menstrual cycle. Wants to determine if BV or yeast infection.

## 2021-04-03 ENCOUNTER — Encounter: Payer: Self-pay | Admitting: Obstetrics and Gynecology

## 2021-04-03 ENCOUNTER — Other Ambulatory Visit: Payer: Self-pay

## 2021-04-03 ENCOUNTER — Ambulatory Visit (INDEPENDENT_AMBULATORY_CARE_PROVIDER_SITE_OTHER): Payer: 59 | Admitting: Obstetrics and Gynecology

## 2021-04-03 ENCOUNTER — Other Ambulatory Visit (HOSPITAL_COMMUNITY)
Admission: RE | Admit: 2021-04-03 | Discharge: 2021-04-03 | Disposition: A | Discharge status: Home / Self Care | Payer: 59 | Source: Ambulatory Visit | Attending: Obstetrics and Gynecology | Admitting: Obstetrics and Gynecology

## 2021-04-03 VITALS — BP 105/66 | HR 82 | Resp 16 | Ht 64.5 in | Wt 123.1 lb

## 2021-04-03 DIAGNOSIS — Z124 Encounter for screening for malignant neoplasm of cervix: Secondary | ICD-10-CM | POA: Insufficient documentation

## 2021-04-03 DIAGNOSIS — N946 Dysmenorrhea, unspecified: Secondary | ICD-10-CM | POA: Diagnosis not present

## 2021-04-03 DIAGNOSIS — N941 Unspecified dyspareunia: Secondary | ICD-10-CM | POA: Diagnosis not present

## 2021-04-03 DIAGNOSIS — Z01419 Encounter for gynecological examination (general) (routine) without abnormal findings: Secondary | ICD-10-CM

## 2021-04-03 MED ORDER — LEVONORGEST-ETH ESTRAD 91-DAY 0.15-0.03 &0.01 MG PO TABS: 1.0000 | ORAL_TABLET | Freq: Every day | ORAL | 1 refills | Status: AC

## 2021-04-03 NOTE — Progress Notes (Signed)
HPI:      Ms. Kathy Sharp is a 45 y.o. [phone] who LMP was Patient's last menstrual period was 03/23/2021 (exact date).  Subjective:   She presents today for her annual examination.  For annual examination.  In addition to this she has significant dysmenorrhea with the pain being so bad for 1 day that she is limited in her activities.  She also complains of dyspareunia in certain positions.  She believes this has been present since her tubal ligation.  She has normal regular menses each month. She is not up-to-date on mammography or Pap smears. She has a tubal ligation for birth control.    Hx: The following portions of the patient's history were reviewed and updated as appropriate:             She  has a past medical history of Anemia and Bacterial vaginosis. She does not have any pertinent problems on file. She  has a past surgical history that includes Tubal ligation (Bilateral, 10/17/2014). Her family history includes Alzheimer's disease in her paternal grandmother; Cancer in her father and mother; Colon cancer in her father and maternal grandfather; Stomach cancer in her mother. She  reports that she has never smoked. She has never been exposed to tobacco smoke. She has never used smokeless tobacco. She reports that she does not drink alcohol and does not use drugs. She has a current medication list which includes the following prescription(s): levonorgestrel-ethinyl estradiol. She is allergic to penicillins and latex.       Review of Systems:  Review of Systems  Constitutional: Denied constitutional symptoms, night sweats, recent illness, fatigue, fever, insomnia and weight loss.  Eyes: Denied eye symptoms, eye pain, photophobia, vision change and visual disturbance.  Ears/Nose/Throat/Neck: Denied ear, nose, throat or neck symptoms, hearing loss, nasal discharge, sinus congestion and sore throat.  Cardiovascular: Denied cardiovascular symptoms, arrhythmia, chest pain/pressure, edema,  exercise intolerance, orthopnea and palpitations.  Respiratory: Denied pulmonary symptoms, asthma, pleuritic pain, productive sputum, cough, dyspnea and wheezing.  Gastrointestinal: Denied, gastro-esophageal reflux, melena, nausea and vomiting.  Genitourinary: See HPI for additional information.  Musculoskeletal: Denied musculoskeletal symptoms, stiffness, swelling, muscle weakness and myalgia.  Dermatologic: Denied dermatology symptoms, rash and scar.  Neurologic: Denied neurology symptoms, dizziness, headache, neck pain and syncope.  Psychiatric: Denied psychiatric symptoms, anxiety and depression.  Endocrine: Denied endocrine symptoms including hot flashes and night sweats.   Meds:   No current outpatient medications on file prior to visit.   No current facility-administered medications on file prior to visit.     Upstream - 04/03/21 1025       Contraception Wrap Up   Contraception Counseling Provided Yes            The pregnancy intention screening data noted above was reviewed. Potential methods of contraception were discussed. The patient elected to proceed with Female Sterilization.   Objective:     Vitals:   04/03/21 0920  BP: 105/66  Pulse: 82  Resp: 16    Filed Weights   04/03/21 0920  Weight: 123 lb 1.6 oz (55.8 kg)              Physical examination General NAD, Conversant  HEENT Atraumatic; Op clear with mmm.  Normo-cephalic. Pupils reactive. Anicteric sclerae  Thyroid/Neck Smooth without nodularity or enlargement. Normal ROM.  Neck Supple.  Skin No rashes, lesions or ulceration. Normal palpated skin turgor. No nodularity.  Breasts: No masses or discharge.  Symmetric.  No axillary adenopathy.  Lungs:  Clear to auscultation.No rales or wheezes. Normal Respiratory effort, no retractions.  Heart: NSR.  No murmurs or rubs appreciated. No periferal edema  Abdomen: Soft.  Non-tender.  No masses.  No HSM. No hernia  Extremities: Moves all appropriately.   Normal ROM for age. No lymphadenopathy.  Neuro: Oriented to PPT.  Normal mood. Normal affect.     Pelvic:   Vulva: Normal appearance.  No lesions.  Vagina: No lesions or abnormalities noted.  Support: Normal pelvic support.  Urethra No masses tenderness or scarring.  Meatus Normal size without lesions or prolapse.  Cervix: Normal appearance.  No lesions.  Anus: Normal exam.  No lesions.  Perineum: Normal exam.  No lesions.        Bimanual   Uterus: 10 to 12 weeks size cervical motion tenderness-direct pain with palpation of the uterus.  Adnexae: No masses.  Non-tender to palpation.  Cul-de-sac: Negative for abnormality.     Assessment:    [id number] Patient Active Problem List   Diagnosis Date Noted   NSVD (normal spontaneous vaginal delivery) 10/17/2014   Encounter for sterilization Postpartum  10/17/2014   Group B streptococcal bacteriuria 09/27/2014   Abnormal ultrasonic finding on antenatal screening of mother 07/09/2014   Supervision of other normal pregnancy 05/26/2014   AMA (advanced maternal age) multigravida 35+ 05/26/2014     1. Well woman exam with routine gynecological exam   2. Dysmenorrhea   3. Dyspareunia, female     Patient has pelvic pain with her menses and pelvic pain with intercourse.  Pain seems to be localized to the uterus which is somewhat enlarged.  Question uterine fibroids versus adenomyosis.   Plan:            1.  Basic Screening Recommendations The basic screening recommendations for asymptomatic women were discussed with the patient during her visit.  The age-appropriate recommendations were discussed with her and the rational for the tests reviewed.  When I am informed by the patient that another primary care physician has previously obtained the age-appropriate tests and they are up-to-date, only outstanding tests are ordered and referrals given as necessary.  Abnormal results of tests will be discussed with her when all of her results are  completed.  Routine preventative health maintenance measures emphasized: Exercise/Diet/Weight control, Tobacco Warnings, Alcohol/Substance use risks and Stress Management Pap performed-blood work and mammogram ordered. 2.  Patient has elected to begin OCPs in attempt to improve her dysmenorrhea and make her cycle more regular.  It may also decrease her dyspareunia.  She will do a 41-month trial.  She would like to go on continuous (83-month) OCPs. 3.  Should she fail OCPs -recommend pelvic ultrasound to check for fibroids/adenomyosis. 4.  Also Future consideration would include Mirena IUD.   Orders Orders Placed This Encounter  Procedures   MM DIGITAL SCREENING BILATERAL   Basic metabolic panel   CBC   Hemoglobin A1c   Lipid panel   TSH     Meds ordered this encounter  Medications   Levonorgestrel-Ethinyl Estradiol (AMETHIA) 0.15-0.03 &0.01 MG tablet    Sig: Take 1 tablet by mouth at bedtime.    Dispense:  84 tablet    Refill:  1          F/U  Return in about 4 months (around 08/04/2021). I spent 12 minutes of additional time involved discussing her slightly enlarged uterus, uterine fibroids, pelvic pain and pain with intercourse.  Multiple options for diagnosis and treatment discussed in detail.  This  was in addition to my involvement in the care of this patient preparing to see the patient by obtaining and reviewing her medical history (including labs, imaging tests and prior procedures), documenting clinical information in the electronic health record (EHR), counseling and coordinating care plans, writing and sending prescriptions, ordering tests or procedures and in direct communicating with the patient and medical staff discussing pertinent items from her history and physical exam.  Elonda Husky, M.D. 04/03/2021 10:23 AM

## 2021-04-03 NOTE — Addendum Note (Signed)
Addended by: Tommie Raymond on: 04/03/2021 10:52 AM   Modules accepted: Orders

## 2021-04-03 NOTE — Addendum Note (Signed)
Addended by: Elonda Husky on: 04/03/2021 11:03 AM   Modules accepted: Level of Service

## 2021-04-04 LAB — CYTOLOGY - PAP
Comment: NEGATIVE
Diagnosis: NEGATIVE
High risk HPV: NEGATIVE

## 2021-04-07 ENCOUNTER — Other Ambulatory Visit: Payer: Self-pay

## 2021-04-07 ENCOUNTER — Other Ambulatory Visit: Payer: 59

## 2021-04-10 LAB — CBC
Hematocrit: 26.7 % — ABNORMAL LOW (ref 34.0–46.6)
Hemoglobin: 7.2 g/dL — ABNORMAL LOW (ref 11.1–15.9)
MCH: 18.4 pg — ABNORMAL LOW (ref 26.6–33.0)
MCHC: 27 g/dL — ABNORMAL LOW (ref 31.5–35.7)
MCV: 68 fL — ABNORMAL LOW (ref 79–97)
Platelets: 328 10*3/uL (ref 150–450)
RBC: 3.92 x10E6/uL (ref 3.77–5.28)
RDW: 18.1 % — ABNORMAL HIGH (ref 11.7–15.4)
WBC: 3 10*3/uL — ABNORMAL LOW (ref 3.4–10.8)

## 2021-04-10 LAB — LIPID PANEL
Chol/HDL Ratio: 2 ratio (ref 0.0–4.4)
Cholesterol, Total: 136 mg/dL (ref 100–199)
HDL: 69 mg/dL (ref >39)
LDL Chol Calc (NIH): 55 mg/dL (ref 0–99)
Triglycerides: 52 mg/dL (ref 0–149)
VLDL Cholesterol Cal: 12 mg/dL (ref 5–40)

## 2021-04-10 LAB — BASIC METABOLIC PANEL
BUN/Creatinine Ratio: 20 (ref 9–23)
BUN: 13 mg/dL (ref 6–24)
CO2: 19 mmol/L — ABNORMAL LOW (ref 20–29)
Calcium: 9.2 mg/dL (ref 8.7–10.2)
Chloride: 104 mmol/L (ref 96–106)
Creatinine, Ser: 0.65 mg/dL (ref 0.57–1.00)
Glucose: 76 mg/dL (ref 70–99)
Potassium: 4.4 mmol/L (ref 3.5–5.2)
Sodium: 137 mmol/L (ref 134–144)
eGFR: 112 mL/min/{1.73_m2} (ref >59)

## 2021-04-10 LAB — HEMOGLOBIN A1C
Est. average glucose Bld gHb Est-mCnc: 105 mg/dL
Hgb A1c MFr Bld: 5.3 % (ref 4.8–5.6)

## 2021-04-10 LAB — TSH: TSH: 1.38 u[IU]/mL (ref 0.450–4.500)

## 2021-04-11 NOTE — Progress Notes (Signed)
I noticed that your hemoglobin is very low.  This is likely from having those heavy periods.  I would recommend you begin taking iron pills which are over-the-counter.  Take these twice a day with food.  I also expect that being on the birth control pills should decrease your bleeding and over the next few months your decreased bleeding combined with your increased iron intake will improve your hemoglobin levels.

## 2021-08-04 ENCOUNTER — Encounter: Payer: Self-pay | Admitting: Obstetrics and Gynecology

## 2021-08-08 ENCOUNTER — Encounter: Payer: Medicaid Other | Admitting: Obstetrics and Gynecology

## 2024-04-23 ENCOUNTER — Emergency Department: Payer: Self-pay

## 2024-04-23 ENCOUNTER — Other Ambulatory Visit: Payer: Self-pay

## 2024-04-23 ENCOUNTER — Inpatient Hospital Stay
Admission: EM | Admit: 2024-04-23 | Discharge: 2024-04-25 | DRG: 690 | Disposition: A | Discharge status: Home / Self Care | Payer: Self-pay | Attending: Student | Admitting: Student

## 2024-04-23 DIAGNOSIS — A419 Sepsis, unspecified organism: Secondary | ICD-10-CM

## 2024-04-23 DIAGNOSIS — N12 Tubulo-interstitial nephritis, not specified as acute or chronic: Principal | ICD-10-CM

## 2024-04-23 DIAGNOSIS — Q6102 Congenital multiple renal cysts: Secondary | ICD-10-CM

## 2024-04-23 DIAGNOSIS — R509 Fever, unspecified: Secondary | ICD-10-CM

## 2024-04-23 DIAGNOSIS — Z820 Family history of epilepsy and other diseases of the nervous system: Secondary | ICD-10-CM

## 2024-04-23 DIAGNOSIS — R1031 Right lower quadrant pain: Secondary | ICD-10-CM

## 2024-04-23 DIAGNOSIS — D500 Iron deficiency anemia secondary to blood loss (chronic): Secondary | ICD-10-CM | POA: Diagnosis present

## 2024-04-23 DIAGNOSIS — N83202 Unspecified ovarian cyst, left side: Secondary | ICD-10-CM

## 2024-04-23 DIAGNOSIS — B9620 Unspecified Escherichia coli [E. coli] as the cause of diseases classified elsewhere: Secondary | ICD-10-CM | POA: Diagnosis present

## 2024-04-23 DIAGNOSIS — D259 Leiomyoma of uterus, unspecified: Secondary | ICD-10-CM | POA: Diagnosis present

## 2024-04-23 DIAGNOSIS — E876 Hypokalemia: Secondary | ICD-10-CM | POA: Diagnosis present

## 2024-04-23 DIAGNOSIS — E538 Deficiency of other specified B group vitamins: Secondary | ICD-10-CM | POA: Diagnosis present

## 2024-04-23 DIAGNOSIS — Z91040 Latex allergy status: Secondary | ICD-10-CM

## 2024-04-23 DIAGNOSIS — E559 Vitamin D deficiency, unspecified: Secondary | ICD-10-CM | POA: Diagnosis present

## 2024-04-23 DIAGNOSIS — N920 Excessive and frequent menstruation with regular cycle: Secondary | ICD-10-CM | POA: Diagnosis present

## 2024-04-23 DIAGNOSIS — N858 Other specified noninflammatory disorders of uterus: Secondary | ICD-10-CM

## 2024-04-23 DIAGNOSIS — D649 Anemia, unspecified: Secondary | ICD-10-CM

## 2024-04-23 DIAGNOSIS — N10 Acute tubulo-interstitial nephritis: Principal | ICD-10-CM | POA: Diagnosis present

## 2024-04-23 DIAGNOSIS — Z880 Allergy status to penicillin: Secondary | ICD-10-CM

## 2024-04-23 LAB — URINALYSIS, W/ REFLEX TO CULTURE (INFECTION SUSPECTED)
Bilirubin Urine: NEGATIVE
Glucose, UA: NEGATIVE mg/dL
Hgb urine dipstick: NEGATIVE
Ketones, ur: NEGATIVE mg/dL
Nitrite: NEGATIVE
Protein, ur: NEGATIVE mg/dL
Specific Gravity, Urine: 1.01 (ref 1.005–1.030)
WBC, UA: >50 WBC/hpf (ref 0–5)
pH: 5 (ref 5.0–8.0)

## 2024-04-23 LAB — COMPREHENSIVE METABOLIC PANEL WITH GFR
ALT: 15 U/L (ref 0–44)
AST: 22 U/L (ref 15–41)
Albumin: 3.7 g/dL (ref 3.5–5.0)
Alkaline Phosphatase: 54 U/L (ref 38–126)
Anion gap: 10 (ref 5–15)
BUN: 12 mg/dL (ref 6–20)
CO2: 20 mmol/L — ABNORMAL LOW (ref 22–32)
Calcium: 8.7 mg/dL — ABNORMAL LOW (ref 8.9–10.3)
Chloride: 106 mmol/L (ref 98–111)
Creatinine, Ser: 0.84 mg/dL (ref 0.44–1.00)
GFR, Estimated: >60 mL/min (ref >60)
Glucose, Bld: 98 mg/dL (ref 70–99)
Potassium: 3.3 mmol/L — ABNORMAL LOW (ref 3.5–5.1)
Sodium: 136 mmol/L (ref 135–145)
Total Bilirubin: 0.9 mg/dL (ref 0.0–1.2)
Total Protein: 7.7 g/dL (ref 6.5–8.1)

## 2024-04-23 LAB — PROTIME-INR
INR: 1.1 (ref 0.8–1.2)
Prothrombin Time: 15.1 s (ref 11.4–15.2)

## 2024-04-23 LAB — CBC WITH DIFFERENTIAL/PLATELET
Abs Immature Granulocytes: 0.03 K/uL (ref 0.00–0.07)
Abs Immature Granulocytes: 0.03 K/uL (ref 0.00–0.07)
Basophils Absolute: 0 K/uL (ref 0.0–0.1)
Basophils Absolute: 0 K/uL (ref 0.0–0.1)
Basophils Relative: 0 %
Basophils Relative: 0 %
Eosinophils Absolute: 0 K/uL (ref 0.0–0.5)
Eosinophils Absolute: 0 K/uL (ref 0.0–0.5)
Eosinophils Relative: 0 %
Eosinophils Relative: 0 %
HCT: 21.6 % — ABNORMAL LOW (ref 36.0–46.0)
HCT: 21 % — ABNORMAL LOW (ref 36.0–46.0)
Hemoglobin: 5.6 g/dL — ABNORMAL LOW (ref 12.0–15.0)
Hemoglobin: 5.4 g/dL — ABNORMAL LOW (ref 12.0–15.0)
Immature Granulocytes: 0 %
Immature Granulocytes: 0 %
Lymphocytes Relative: 7 %
Lymphocytes Relative: 9 %
Lymphs Abs: 0.6 K/uL — ABNORMAL LOW (ref 0.7–4.0)
Lymphs Abs: 0.8 K/uL (ref 0.7–4.0)
MCH: 17.2 pg — ABNORMAL LOW (ref 26.0–34.0)
MCH: 17 pg — ABNORMAL LOW (ref 26.0–34.0)
MCHC: 25.9 g/dL — ABNORMAL LOW (ref 30.0–36.0)
MCHC: 25.7 g/dL — ABNORMAL LOW (ref 30.0–36.0)
MCV: 66.5 fL — ABNORMAL LOW (ref 80.0–100.0)
MCV: 66 fL — ABNORMAL LOW (ref 80.0–100.0)
Monocytes Absolute: 0.7 K/uL (ref 0.1–1.0)
Monocytes Absolute: 1.1 K/uL — ABNORMAL HIGH (ref 0.1–1.0)
Monocytes Relative: 10 %
Monocytes Relative: 13 %
Neutro Abs: 6.1 K/uL (ref 1.7–7.7)
Neutro Abs: 6.4 K/uL (ref 1.7–7.7)
Neutrophils Relative %: 83 %
Neutrophils Relative %: 78 %
Platelets: 568 K/uL — ABNORMAL HIGH (ref 150–400)
Platelets: 563 K/uL — ABNORMAL HIGH (ref 150–400)
RBC: 3.25 MIL/uL — ABNORMAL LOW (ref 3.87–5.11)
RBC: 3.18 MIL/uL — ABNORMAL LOW (ref 3.87–5.11)
RDW: 24.9 % — ABNORMAL HIGH (ref 11.5–15.5)
RDW: 25.2 % — ABNORMAL HIGH (ref 11.5–15.5)
Smear Review: NORMAL
WBC: 7.4 K/uL (ref 4.0–10.5)
WBC: 8.3 K/uL (ref 4.0–10.5)
nRBC: 0 % (ref 0.0–0.2)
nRBC: 0 % (ref 0.0–0.2)

## 2024-04-23 LAB — PREPARE RBC (CROSSMATCH)

## 2024-04-23 LAB — RETICULOCYTES
Immature Retic Fract: 19.5 % — ABNORMAL HIGH (ref 2.3–15.9)
RBC.: 3.13 MIL/uL — ABNORMAL LOW (ref 3.87–5.11)
Retic Count, Absolute: 53.8 K/uL (ref 19.0–186.0)
Retic Ct Pct: 1.7 % (ref 0.4–3.1)

## 2024-04-23 LAB — MAGNESIUM: Magnesium: 1.9 mg/dL (ref 1.7–2.4)

## 2024-04-23 LAB — IRON AND TIBC
Iron: 11 ug/dL — ABNORMAL LOW (ref 28–170)
Saturation Ratios: 3 % — ABNORMAL LOW (ref 10.4–31.8)
TIBC: 409 ug/dL (ref 250–450)
UIBC: 398 ug/dL

## 2024-04-23 LAB — TROPONIN I (HIGH SENSITIVITY): Troponin I (High Sensitivity): 7 ng/L (ref <18)

## 2024-04-23 LAB — LACTIC ACID, PLASMA
Lactic Acid, Venous: 1.2 mmol/L (ref 0.5–1.9)
Lactic Acid, Venous: 1.4 mmol/L (ref 0.5–1.9)

## 2024-04-23 LAB — RESP PANEL BY RT-PCR (RSV, FLU A&B, COVID)  RVPGX2
Influenza A by PCR: NEGATIVE
Influenza B by PCR: NEGATIVE
Resp Syncytial Virus by PCR: NEGATIVE
SARS Coronavirus 2 by RT PCR: NEGATIVE

## 2024-04-23 LAB — PREGNANCY, URINE: Preg Test, Ur: NEGATIVE

## 2024-04-23 LAB — FERRITIN: Ferritin: 4 ng/mL — ABNORMAL LOW (ref 11–307)

## 2024-04-23 MED ORDER — SODIUM CHLORIDE 0.9 % IV SOLN
10.0000 mL/h | Freq: Once | INTRAVENOUS | Status: AC
  Administered 2024-04-23: 10 mL/h via INTRAVENOUS

## 2024-04-23 MED ORDER — ACETAMINOPHEN 325 MG PO TABS
650.0000 mg | ORAL_TABLET | Freq: Four times a day (QID) | ORAL | Status: DC | PRN
  Administered 2024-04-24: 650 mg via ORAL
  Filled 2024-04-23: qty 2

## 2024-04-23 MED ORDER — HYDROCODONE-ACETAMINOPHEN 5-325 MG PO TABS: 1.0000 | ORAL_TABLET | ORAL | Status: DC | PRN

## 2024-04-23 MED ORDER — LEVOFLOXACIN IN D5W 750 MG/150ML IV SOLN
750.0000 mg | Freq: Once | INTRAVENOUS | Status: AC
  Administered 2024-04-23: 750 mg via INTRAVENOUS
  Filled 2024-04-23: qty 150

## 2024-04-23 MED ORDER — SENNOSIDES-DOCUSATE SODIUM 8.6-50 MG PO TABS: 1.0000 | ORAL_TABLET | Freq: Every evening | ORAL | Status: DC | PRN

## 2024-04-23 MED ORDER — IRON SUCROSE 500 MG IVPB - SIMPLE MED
500.0000 mg | Freq: Once | INTRAVENOUS | Status: AC
  Administered 2024-04-24: 500 mg via INTRAVENOUS
  Filled 2024-04-23: qty 500

## 2024-04-23 MED ORDER — LACTATED RINGERS IV BOLUS (SEPSIS)
1000.0000 mL | Freq: Once | INTRAVENOUS | Status: AC
  Administered 2024-04-23: 1000 mL via INTRAVENOUS

## 2024-04-23 MED ORDER — ONDANSETRON HCL 4 MG/2ML IJ SOLN: 4.0000 mg | Freq: Four times a day (QID) | INTRAMUSCULAR | Status: DC | PRN

## 2024-04-23 MED ORDER — ACETAMINOPHEN 650 MG RE SUPP: 650.0000 mg | Freq: Four times a day (QID) | RECTAL | Status: DC | PRN

## 2024-04-23 MED ORDER — POTASSIUM CHLORIDE CRYS ER 20 MEQ PO TBCR
40.0000 meq | EXTENDED_RELEASE_TABLET | Freq: Once | ORAL | Status: AC
  Administered 2024-04-23: 40 meq via ORAL
  Filled 2024-04-23: qty 2

## 2024-04-23 MED ORDER — FERROUS SULFATE 325 (65 FE) MG PO TBEC: 325.0000 mg | DELAYED_RELEASE_TABLET | Freq: Two times a day (BID) | ORAL | 3 refills | Status: DC

## 2024-04-23 MED ORDER — LACTATED RINGERS IV SOLN: INTRAVENOUS | Status: AC

## 2024-04-23 MED ORDER — SODIUM CHLORIDE 0.9 % IV SOLN
2.0000 g | INTRAVENOUS | Status: DC
  Administered 2024-04-24: 2 g via INTRAVENOUS
  Filled 2024-04-23 (×2): qty 20

## 2024-04-23 MED ORDER — BISACODYL 5 MG PO TBEC: 5.0000 mg | DELAYED_RELEASE_TABLET | Freq: Every day | ORAL | Status: DC | PRN

## 2024-04-23 MED ORDER — METRONIDAZOLE 500 MG/100ML IV SOLN
500.0000 mg | Freq: Once | INTRAVENOUS | Status: AC
  Administered 2024-04-23: 500 mg via INTRAVENOUS
  Filled 2024-04-23: qty 100

## 2024-04-23 MED ORDER — ONDANSETRON HCL 4 MG PO TABS: 4.0000 mg | ORAL_TABLET | Freq: Four times a day (QID) | ORAL | Status: DC | PRN

## 2024-04-23 MED ORDER — ACETAMINOPHEN 325 MG PO TABS
650.0000 mg | ORAL_TABLET | Freq: Once | ORAL | Status: AC | PRN
  Administered 2024-04-23: 650 mg via ORAL
  Filled 2024-04-23: qty 2

## 2024-04-23 MED ORDER — IOHEXOL 300 MG/ML  SOLN
100.0000 mL | Freq: Once | INTRAMUSCULAR | Status: AC | PRN
  Administered 2024-04-23: 100 mL via INTRAVENOUS

## 2024-04-23 NOTE — ED Notes (Signed)
 Patient taken to imaging.

## 2024-04-23 NOTE — Sepsis Progress Note (Signed)
 Notified bedside nurse of need to administer antibiotics and need some BP's documented.

## 2024-04-23 NOTE — Consult Note (Signed)
 CODE SEPSIS - PHARMACY COMMUNICATION  **Broad Spectrum Antibiotics should be administered within 1 hour of Sepsis diagnosis**  Time Code Sepsis Called/Page Received: 13:41  Antibiotics Ordered: Levofloxacin 750mg  x1, flagyl  500mg  x1  Time of 1st antibiotic administration: 1459  Additional action taken by pharmacy: Messaged RN to ensure timely start of antibiotics.  If necessary, Name of Provider/Nurse Contacted: Ernest Lelon Annabella LOISE Haynes ,PharmD Clinical Pharmacist  04/23/2024  2:16 PM

## 2024-04-23 NOTE — ED Triage Notes (Signed)
 Pt arrives from [facility] due to N/V with chills since Monday with abd pain.

## 2024-04-23 NOTE — Sepsis Progress Note (Signed)
 Elink monitoring for the code sepsis protocol.

## 2024-04-23 NOTE — Plan of Care (Signed)

## 2024-04-23 NOTE — ED Triage Notes (Signed)
 First nurse note: Pt to ED via POV from [facility]. Pt reports fever, N/V and chills since Monday. 100.4 temp PTA. Pt reports RLQ pain that started last pm.

## 2024-04-23 NOTE — H&P (Signed)
 History and Physical    Kathy Sharp FMW:[id number] DOB: 12/14/1975 DOA: 04/23/2024  PCP: Patient, No Pcp Per (Confirm with patient/family/NH records and if not entered, this has to be entered at [facility] point of entry) Patient coming from: Home  I have personally briefly reviewed patient's old medical records in [facility]  Chief Complaint: Right flank pain, fever/chills, nauseous vomiting, dysuria  HPI: Kathy Sharp is a 48 y.o. female with medical history significant of menorrhagia, presented with multiple complaints including generalized weakness, right flank pain, fever/chills, nausea/vomiting and dysuria.  Symptoms started 3 days ago, patient started to have burning sensations and urinary frequency, initially she thought that was related to  constipation, but soon she started to develop episode chills and fever, checked temperature Tmax was 100.3.  Since yesterday she also started develop nauseous and vomiting.  She reported heavy menstrual period and irregular, with large amount blood clots passing during menstrual period.  Does not have a OB/GYN doctor due to insurance issue.  ED Course: Temperature 102.7 tachycardia blood pressure 112/64 O2 saturation 100% on room air.  UA showed 3+ WBC, 1+ RBC.  CT abdomen pelvis showed large uterine fibroid otherwise no acute findings.  Patient was given Levaquin and Flagyl  in the ED.  Review of Systems: As per HPI otherwise 14 point review of systems negative.    Past Medical History:  Diagnosis Date   Anemia    Bacterial vaginosis     Past Surgical History:  Procedure Laterality Date   TUBAL LIGATION Bilateral 10/17/2014   Procedure: POST PARTUM TUBAL LIGATION;  Surgeon: Norleen Edsel GAILS, MD;  Location: [facility];  Service: Gynecology;  Laterality: Bilateral;     reports that she has never smoked. She has never been exposed to tobacco smoke. She has never used smokeless tobacco. She reports that she does not drink alcohol and does not use  drugs.  Allergies  Allergen Reactions   Penicillins Anaphylaxis, Hives and Other (See Comments)    Pt has taken cephalosporins without any problem.    Latex Other (See Comments)    Pt states she had Irritation in vagina from gloves after exam.    Family History  Problem Relation Age of Onset   Cancer Mother    Stomach cancer Mother    Cancer Father    Colon cancer Father    Colon cancer Maternal Grandfather    Alzheimer's disease Paternal Grandmother    Breast cancer Neg Hx      Prior to Admission medications   Medication Sig Start Date End Date Taking? Authorizing Provider  Levonorgestrel -Ethinyl Estradiol (AMETHIA) 0.15-0.03 &0.01 MG tablet Take 1 tablet by mouth at bedtime. 04/03/21   Janit Alm Agent, MD    Physical Exam: Vitals:   04/23/24 1630 04/23/24 1634 04/23/24 1645 04/23/24 1700  BP: 101/66 101/66  112/66  Pulse: 88  80 82  Resp: 18  15 15   Temp: 97.9 F (36.6 C) 97.9 F (36.6 C)    TempSrc: Oral     SpO2: 98%  100% 100%  Weight:      Height:        Constitutional: NAD, calm, comfortable Vitals:   04/23/24 1630 04/23/24 1634 04/23/24 1645 04/23/24 1700  BP: 101/66 101/66  112/66  Pulse: 88  80 82  Resp: 18  15 15   Temp: 97.9 F (36.6 C) 97.9 F (36.6 C)    TempSrc: Oral     SpO2: 98%  100% 100%  Weight:  Height:       Eyes: PERRL, lids and conjunctivae normal ENMT: Mucous membranes are moist. Posterior pharynx clear of any exudate or lesions.Normal dentition.  Neck: normal, supple, no masses, no thyromegaly Respiratory: clear to auscultation bilaterally, no wheezing, no crackles. Normal respiratory effort. No accessory muscle use.  Cardiovascular: Regular rate and rhythm, no murmurs / rubs / gallops. No extremity edema. 2+ pedal pulses. No carotid bruits.  Abdomen: Right CVA tenderness, no masses palpated. No hepatosplenomegaly. Bowel sounds positive.  Musculoskeletal: no clubbing / cyanosis. No joint deformity upper and lower  extremities. Good ROM, no contractures. Normal muscle tone.  Skin: no rashes, lesions, ulcers. No induration Neurologic: CN 2-12 grossly intact. Sensation intact, DTR normal. Strength 5/5 in all 4.  Psychiatric: Normal judgment and insight. Alert and oriented x 3. Normal mood.    Labs on Admission: I have personally reviewed following labs and imaging studies  CBC: Recent Labs  Lab 04/23/24 1233 04/23/24 1426  WBC 7.4 8.3  NEUTROABS 6.1 6.4  HGB 5.6* 5.4*  HCT 21.6* 21.0*  MCV 66.5* 66.0*  PLT 568* 563*   Basic Metabolic Panel: Recent Labs  Lab 04/23/24 1233  NA 136  K 3.3*  CL 106  CO2 20*  GLUCOSE 98  BUN 12  CREATININE 0.84  CALCIUM 8.7*   GFR: Estimated Creatinine Clearance: 74.2 mL/min (by C-G formula based on SCr of 0.84 mg/dL). Liver Function Tests: Recent Labs  Lab 04/23/24 1233  AST 22  ALT 15  ALKPHOS 54  BILITOT 0.9  PROT 7.7  ALBUMIN 3.7   No results for input(s): LIPASE, AMYLASE in the last 168 hours. No results for input(s): AMMONIA in the last 168 hours. Coagulation Profile: Recent Labs  Lab 04/23/24 1420  INR 1.1   Cardiac Enzymes: No results for input(s): CKTOTAL, CKMB, CKMBINDEX, TROPONINI in the last 168 hours. BNP (last 3 results) No results for input(s): PROBNP in the last 8760 hours. HbA1C: No results for input(s): HGBA1C in the last 72 hours. CBG: No results for input(s): GLUCAP in the last 168 hours. Lipid Profile: No results for input(s): CHOL, HDL, LDLCALC, TRIG, CHOLHDL, LDLDIRECT in the last 72 hours. Thyroid Function Tests: No results for input(s): TSH, T4TOTAL, FREET4, T3FREE, THYROIDAB in the last 72 hours. Anemia Panel: Recent Labs    04/23/24 1419  RETICCTPCT 1.7   Urine analysis:    Component Value Date/Time   COLORURINE YELLOW (A) 04/23/2024 1419   APPEARANCEUR HAZY (A) 04/23/2024 1419   LABSPEC 1.010 04/23/2024 1419   PHURINE 5.0 04/23/2024 1419   GLUCOSEU  NEGATIVE 04/23/2024 1419   HGBUR NEGATIVE 04/23/2024 1419   BILIRUBINUR NEGATIVE 04/23/2024 1419   BILIRUBINUR neg 12/30/2013 1828   KETONESUR NEGATIVE 04/23/2024 1419   PROTEINUR NEGATIVE 04/23/2024 1419   UROBILINOGEN 0.2 05/22/2014 1531   NITRITE NEGATIVE 04/23/2024 1419   LEUKOCYTESUR MODERATE (A) 04/23/2024 1419    Radiological Exams on Admission: CT ABDOMEN PELVIS W CONTRAST Result Date: 04/23/2024 CLINICAL DATA:  Right lower quadrant pain with fever, nausea, vomiting and chills 4 days. EXAM: CT ABDOMEN AND PELVIS WITH CONTRAST TECHNIQUE: Multidetector CT imaging of the abdomen and pelvis was performed using the standard protocol following bolus administration of intravenous contrast. RADIATION DOSE REDUCTION: This exam was performed according to the departmental dose-optimization program which includes automated exposure control, adjustment of the mA and/or kV according to patient size and/or use of iterative reconstruction technique. CONTRAST:  100mL OMNIPAQUE IOHEXOL 300 MG/ML  SOLN COMPARISON:  None Available. FINDINGS:  Lower chest: Heart is normal size.  Visualized lung bases are clear. Hepatobiliary: Liver, gallbladder and biliary tree are normal. Pancreas: Normal. Spleen: Normal. Adrenals/Urinary Tract: Adrenal glands are normal. Kidneys are normal size. There is no hydronephrosis or nephrolithiasis. There are a few bilateral subcentimeter renal cortical hypodensities too small to characterize but likely cysts. No further imaging follow-up is recommended. Ureters and bladder are normal. Stomach/Bowel: Stomach and small bowel are normal. Appendix is normal. Colon is normal. Vascular/Lymphatic: Abdominal aorta is normal in caliber. Remaining vascular structures are unremarkable. No adenopathy. Reproductive: Mildly enlarged uterus containing a heterogeneous enhancing rounded mass over the left anterior aspect of the uterine body measuring approximately 6.4 x 7 cm likely a fibroid, but  indeterminate. Evidence of previous bilateral tubal ligation. Right adnexa is unremarkable. There is a 4 cm predominately simple appearing left ovarian cyst. Other: Minimal free fluid in the cul-de-sac. Musculoskeletal: IMPRESSION: 1. No acute findings in the abdomen/pelvis. Normal appendix. 2. 4 cm predominately simple appearing left ovarian cyst. Minimal free fluid in the cul-de-sac. No follow-up imaging recommended. 3. Mildly enlarged uterus containing a heterogeneous enhancing rounded mass over the left anterior aspect of the uterine body measuring 6.4 x 7 cm likely a fibroid, but indeterminate. Recommend pelvic ultrasound on an elective basis for further characterization. 4. Few bilateral subcentimeter renal cortical hypodensities too small to characterize, but likely cysts. Electronically Signed   By: Toribio Agreste M.D.   On: 04/23/2024 15:51   DG Chest Port 1 View Result Date: 04/23/2024 EXAM: 1 VIEW(S) XRAY OF THE CHEST 04/23/2024 02:02:32 PM COMPARISON: 10/10/2011. CLINICAL HISTORY: Questionable sepsis - evaluate for abnormality. Questionable sepsis. FINDINGS: LUNGS AND PLEURA: No focal pulmonary opacity. No pulmonary edema. No pleural effusion. No pneumothorax. HEART AND MEDIASTINUM: No acute abnormality of the cardiac and mediastinal silhouettes. BONES AND SOFT TISSUES: No acute osseous abnormality. IMPRESSION: 1. Normal chest radiograph without acute cardiopulmonary abnormality. Electronically signed by: Waddell Calk MD 04/23/2024 02:35 PM EDT RP Workstation: [id number]    EKG: Independently reviewed.  Sinus rhythm with no acute ST-T changes.  Assessment/Plan Principal Problem:   Pyelonephritis Active Problems:   Iron deficiency anemia due to chronic blood loss  (please populate well all problems here in Problem List. (For example, if patient is on BP meds at home and you resume or decide to hold them, it is a problem that needs to be her. Same for CAD, COPD, HLD and so on)  Acute  pyelonephritis Complicated UTI - Ceftriaxone - Urine culture pending  Symptomatic decompensated chronic iron deficiency anemia Menorrhagia Uterine fibroid - Getting PRBC x 2 - IV iron x 1 tomorrow morning - Getting help from case management to facilitate to get referral to follow up with outpatient OB/GYN  Hypokalemia -P.o. replacement - Check magnesium level, recheck K level tomorrow morning  DVT prophylaxis: SCD Code Status: Full code Family Communication: None at bedside Disposition Plan: Patient is sick with acute pyelonephritis requiring IV antibiotics, severe iron deficiency anemia requiring inpatient transfusion, expect more than 2 midnight hospital stay. Consults called: None Admission status: MedSurg admission   Cort ONEIDA Mana MD [facility] Pager [phone]  04/23/2024, 5:27 PM

## 2024-04-23 NOTE — ED Provider Notes (Signed)
 [name] Provider Note    Event Date/Time   First MD Initiated Contact with Patient 04/23/24 1339     (approximate)   History   Abdominal Pain, Nausea, and Emesis   HPI  Kathy Sharp is a 48 y.o. female presenting with nausea vomiting and lightheadedness.  States that she has had decreased p.o. intake, has been tolerating some crackers and some fluids.  Does note chills, fevers.  States that she is otherwise healthy.  She denies any urinary symptoms or diarrhea, states her son had some chest congestion and allergy symptoms but he is well-appearing.  She noted some congestion but no cough.  States that has been having some aching to her chest after repeated nausea vomiting.  No shortness of breath.  No recent travel.  On independent review, she was seen by primary care today for nausea and vomiting, symptoms started on Monday night.  Has been having bodyaches, fevers, chills.  Tried saltine crackers and is able to keep some fluid down.  Also notes some epigastric abdominal pain.     Physical Exam   Triage Vital Signs: ED Triage Vitals [04/23/24 1229]  Encounter Vitals Group     BP 112/64     Girls Systolic BP Percentile      Girls Diastolic BP Percentile      Boys Systolic BP Percentile      Boys Diastolic BP Percentile      Pulse Rate (!) 119     Resp 18     Temp (!) 102.7 F (39.3 C)     Temp Source Oral     SpO2 100 %     Weight 124 lb (56.2 kg)     Height 5' 5 (1.651 m)     Head Circumference      Peak Flow      Pain Score 3     Pain Loc      Pain Education      Exclude from Growth Chart     Most recent vital signs: Vitals:   04/23/24 1601 04/23/24 1601  BP: 105/73 105/73  Pulse: 90 90  Resp: 16 16  Temp: 98.1 F (36.7 C) 98.1 F (36.7 C)  SpO2: 100% 100%     General: Awake, no distress.  CV:  Good peripheral perfusion.  Resp:  Normal effort.  No tachypnea or respiratory distress Abd:  No distention.  Soft, tender to the  right lower quadrant without guarding Other:  No flank or CVA tenderness, she is ambulatory, nontoxic-appearing   ED Results / Procedures / Treatments   Labs (all labs ordered are listed, but only abnormal results are displayed) Labs Reviewed  COMPREHENSIVE METABOLIC PANEL WITH GFR - Abnormal; Notable for the following components:      Result Value   Potassium 3.3 (*)    CO2 20 (*)    Calcium 8.7 (*)    All other components within normal limits  CBC WITH DIFFERENTIAL/PLATELET - Abnormal; Notable for the following components:   RBC 3.25 (*)    Hemoglobin 5.6 (*)    HCT 21.6 (*)    MCV 66.5 (*)    MCH 17.2 (*)    MCHC 25.9 (*)    RDW 24.9 (*)    Platelets 568 (*)    Lymphs Abs 0.6 (*)    All other components within normal limits  URINALYSIS, W/ REFLEX TO CULTURE (INFECTION SUSPECTED) - Abnormal; Notable for the following components:   Color, Urine  YELLOW (*)    APPearance HAZY (*)    Leukocytes,Ua MODERATE (*)    Bacteria, UA RARE (*)    All other components within normal limits  CBC WITH DIFFERENTIAL/PLATELET - Abnormal; Notable for the following components:   RBC 3.18 (*)    Hemoglobin 5.4 (*)    HCT 21.0 (*)    MCV 66.0 (*)    MCH 17.0 (*)    MCHC 25.7 (*)    RDW 25.2 (*)    Platelets 563 (*)    Monocytes Absolute 1.1 (*)    All other components within normal limits  RESP PANEL BY RT-PCR (RSV, FLU A&B, COVID)  RVPGX2  CULTURE, BLOOD (ROUTINE X 2)  CULTURE, BLOOD (ROUTINE X 2)  URINE CULTURE  LACTIC ACID, PLASMA  LACTIC ACID, PLASMA  PREGNANCY, URINE  PROTIME-INR  IRON AND TIBC  FERRITIN  RETICULOCYTES  MAGNESIUM  TYPE AND SCREEN  PREPARE RBC (CROSSMATCH)  TROPONIN I (HIGH SENSITIVITY)     EKG  EKG shows, sinus rhythm, rate 87, normal QS, normal QTc, no obvious ischemic ST elevation, T wave flattening in 3, no prior to compare   RADIOLOGY On my independent interpretation, chest x-ray without obvious consolidation   PROCEDURES:  Critical Care  performed: Yes, see critical care procedure note(s)  .Critical Care  Performed by: Waymond Lorelle Cummins, MD Authorized by: Waymond Lorelle Cummins, MD   Critical care provider statement:    Critical care time (minutes):  40   Critical care was necessary to treat or prevent imminent or life-threatening deterioration of the following conditions:  Sepsis (Hemoglobin less than 7)   Critical care was time spent personally by me on the following activities:  Development of treatment plan with patient or surrogate, discussions with consultants, evaluation of patient's response to treatment, examination of patient, ordering and review of laboratory studies, ordering and review of radiographic studies, ordering and performing treatments and interventions, pulse oximetry, re-evaluation of patient's condition and review of old charts    MEDICATIONS ORDERED IN ED: Medications  lactated ringers  infusion (0 mLs Intravenous Paused 04/23/24 1545)  metroNIDAZOLE  (FLAGYL ) IVPB 500 mg (500 mg Intravenous New Bag/Given 04/23/24 1633)  0.9 %  sodium chloride  infusion (has no administration in time range)  potassium chloride SA (KLOR-CON M) CR tablet 40 mEq (has no administration in time range)  acetaminophen  (TYLENOL ) tablet 650 mg (650 mg Oral Given 04/23/24 1237)  lactated ringers  bolus 1,000 mL (0 mLs Intravenous Paused 04/23/24 1545)  levofloxacin (LEVAQUIN) IVPB 750 mg (0 mg Intravenous Stopped 04/23/24 1627)  iohexol (OMNIPAQUE) 300 MG/ML solution 100 mL (100 mLs Intravenous Contrast Given 04/23/24 1527)     IMPRESSION / MDM / ASSESSMENT AND PLAN / ED COURSE  I reviewed the triage vital signs and the nursing notes.                              Differential diagnosis includes, but is not limited to, viral illness, gastroenteritis, colitis, diverticulitis, appendicitis, UTI, dehydration, electrolyte derangements.  Suspect her chest aching is from her multiple bouts of vomiting, no hematemesis or coffee-ground emesis  reported, doubt Mallory-Weiss or Boerhaave's, did consider pneumonia but she has no cough.  Did consider atypical ACS.  Acid reflux.  Will get labs, EKG, troponin, chest x-ray, blood cultures, lactic acid, UA, CT abdomen pelvis, IV fluids, empiric IV antibiotics, Tylenol .  Patient's presentation is most consistent with acute presentation with potential threat to life or  bodily function.  Independent interpretation of labs and imaging below.  Given sepsis secondary to pyelonephritis, anemia, she will need to be admitted for further management.  When back to reassess patient, her blood pressure shows systolic 100s.  She is not nauseous at this time.  Discussed with her about labs and imaging results including incidental findings, she is amenable to stay for further management.  Consulted hospitalist who is agreeable with the plan for admission and will evaluate patient.  She is admitted.  The patient is on the cardiac monitor to evaluate for evidence of arrhythmia and/or significant heart rate changes.   Clinical Course as of 04/23/24 1634  Thu Apr 23, 2024  1354 Review of labs, electrolytes not severely deranged, creatinine is normal, LFTs are normal, no leukocytosis, her hemoglobin is 5.6 but patient has no evidence of bleeding at this time.  She is not thrombocytopenic.  Will repeat the CBC and reassess.  States that she does tend to get heavy menstrual bleeding but more recently her periods have been irregular since she is perimenopausal, states that her last period was in September and that was not too heavy.  She denies any melena or hematochezia, no other bleeding anywhere else.  Her last hematocrit was 7 and that was around 3 years ago. [TT]  1444 DG Chest Port 1 View 1. Normal chest radiograph without acute cardiopulmonary abnormality.  [TT]  1449 CBC with Differential(!) Repeat CBC, she is anemic, platelets are elevated, she is reports no history of elevated platelets in the past or any  malignancies.  He says she has been having night sweats but no unexpected weight loss, no obvious swelling to her neck or other lymphadenopathies. [TT]  1454 Discussed with patient about recent benefits of blood transfusion, she is agreeable to proceed.  Will order 2 units of packed RBCs. [TT]  1528 Lactic Acid, Venous: 1.4 Not elevated [TT]  1529 Urinalysis, w/ Reflex to Culture (Infection Suspected) -Urine, Clean Catch(!) Consistent with UTI [TT]  1543 Troponin I (High Sensitivity): 7 [TT]  1552 Preg Test, Ur: NEGATIVE [TT]  1612 CT ABDOMEN PELVIS W CONTRAST IMPRESSION: 1. No acute findings in the abdomen/pelvis. Normal appendix. 2. 4 cm predominately simple appearing left ovarian cyst. Minimal free fluid in the cul-de-sac. No follow-up imaging recommended. 3. Mildly enlarged uterus containing a heterogeneous enhancing rounded mass over the left anterior aspect of the uterine body measuring 6.4 x 7 cm likely a fibroid, but indeterminate. Recommend pelvic ultrasound on an elective basis for further characterization. 4. Few bilateral subcentimeter renal cortical hypodensities too small to characterize, but likely cysts.   [TT]    Clinical Course User Index [TT] Waymond Lorelle Cummins, MD     FINAL CLINICAL IMPRESSION(S) / ED DIAGNOSES   Final diagnoses:  Fever, unspecified fever cause  Pyelonephritis  Right lower quadrant abdominal pain  Anemia, unspecified type  Cyst of left ovary  Multiple renal cysts  Uterine mass  Sepsis, due to unspecified organism, unspecified whether acute organ dysfunction present [facility])     Rx / DC Orders   ED Discharge Orders     None        Note:  This document was prepared using Dragon voice recognition software and may include unintentional dictation errors.    Waymond Lorelle Cummins, MD 04/23/24 [id number]

## 2024-04-24 LAB — BPAM RBC
Blood Product Expiration Date: 202511212359
Blood Product Expiration Date: 202511212359
ISSUE DATE / TIME: 202510231610
ISSUE DATE / TIME: 202510232021
Unit Type and Rh: 5100
Unit Type and Rh: 5100

## 2024-04-24 LAB — TYPE AND SCREEN
ABO/RH(D): O POS
Antibody Screen: NEGATIVE
Unit division: 0
Unit division: 0

## 2024-04-24 LAB — VITAMIN D 25 HYDROXY (VIT D DEFICIENCY, FRACTURES): Vit D, 25-Hydroxy: 26.04 ng/mL — ABNORMAL LOW (ref 30–100)

## 2024-04-24 LAB — BASIC METABOLIC PANEL WITH GFR
Anion gap: 11 (ref 5–15)
BUN: 8 mg/dL (ref 6–20)
CO2: 22 mmol/L (ref 22–32)
Calcium: 8.7 mg/dL — ABNORMAL LOW (ref 8.9–10.3)
Chloride: 109 mmol/L (ref 98–111)
Creatinine, Ser: 0.9 mg/dL (ref 0.44–1.00)
GFR, Estimated: >60 mL/min (ref >60)
Glucose, Bld: 90 mg/dL (ref 70–99)
Potassium: 3.7 mmol/L (ref 3.5–5.1)
Sodium: 142 mmol/L (ref 135–145)

## 2024-04-24 LAB — HEMOGLOBIN AND HEMATOCRIT, BLOOD
HCT: 27 % — ABNORMAL LOW (ref 36.0–46.0)
Hemoglobin: 7.9 g/dL — ABNORMAL LOW (ref 12.0–15.0)

## 2024-04-24 LAB — CBC
HCT: 29.4 % — ABNORMAL LOW (ref 36.0–46.0)
Hemoglobin: 8.7 g/dL — ABNORMAL LOW (ref 12.0–15.0)
MCH: 21 pg — ABNORMAL LOW (ref 26.0–34.0)
MCHC: 29.6 g/dL — ABNORMAL LOW (ref 30.0–36.0)
MCV: 70.8 fL — ABNORMAL LOW (ref 80.0–100.0)
Platelets: 529 K/uL — ABNORMAL HIGH (ref 150–400)
RBC: 4.15 MIL/uL (ref 3.87–5.11)
RDW: 25.2 % — ABNORMAL HIGH (ref 11.5–15.5)
WBC: 8.3 K/uL (ref 4.0–10.5)
nRBC: 0 % (ref 0.0–0.2)

## 2024-04-24 LAB — VITAMIN B12: Vitamin B-12: 164 pg/mL — ABNORMAL LOW (ref 180–914)

## 2024-04-24 LAB — HIV ANTIBODY (ROUTINE TESTING W REFLEX): HIV Screen 4th Generation wRfx: NONREACTIVE

## 2024-04-24 LAB — PHOSPHORUS: Phosphorus: 2.9 mg/dL (ref 2.5–4.6)

## 2024-04-24 LAB — MAGNESIUM: Magnesium: 1.9 mg/dL (ref 1.7–2.4)

## 2024-04-24 LAB — FOLATE: Folate: >20 ng/mL (ref >5.9)

## 2024-04-24 MED ORDER — PANTOPRAZOLE SODIUM 40 MG PO TBEC
40.0000 mg | DELAYED_RELEASE_TABLET | Freq: Every day | ORAL | Status: DC
  Administered 2024-04-24 – 2024-04-25 (×2): 40 mg via ORAL
  Filled 2024-04-24 (×2): qty 1

## 2024-04-24 MED ORDER — VITAMIN C 500 MG PO TABS
500.0000 mg | ORAL_TABLET | Freq: Every day | ORAL | Status: DC
  Administered 2024-04-24 – 2024-04-25 (×2): 500 mg via ORAL
  Filled 2024-04-24 (×2): qty 1

## 2024-04-24 MED ORDER — POLYSACCHARIDE IRON COMPLEX 150 MG PO CAPS
150.0000 mg | ORAL_CAPSULE | Freq: Every day | ORAL | Status: DC
  Administered 2024-04-24 – 2024-04-25 (×2): 150 mg via ORAL
  Filled 2024-04-24 (×2): qty 1

## 2024-04-24 MED ORDER — ALUM & MAG HYDROXIDE-SIMETH 200-200-20 MG/5ML PO SUSP: 15.0000 mL | Freq: Four times a day (QID) | ORAL | Status: DC | PRN

## 2024-04-24 MED ORDER — VITAMIN D (ERGOCALCIFEROL) 1.25 MG (50000 UNIT) PO CAPS
50000.0000 [IU] | ORAL_CAPSULE | ORAL | Status: DC
  Administered 2024-04-24: 50000 [IU] via ORAL
  Filled 2024-04-24: qty 1

## 2024-04-24 MED ORDER — CYANOCOBALAMIN 1000 MCG/ML IJ SOLN
1000.0000 ug | Freq: Every day | INTRAMUSCULAR | Status: DC
Start: 2024-04-24 — End: 2024-04-25
  Administered 2024-04-24 – 2024-04-25 (×2): 1000 ug via INTRAMUSCULAR
  Filled 2024-04-24 (×2): qty 1

## 2024-04-24 MED ORDER — ALUM & MAG HYDROXIDE-SIMETH 200-200-20 MG/5ML PO SUSP
15.0000 mL | Freq: Once | ORAL | Status: AC
  Administered 2024-04-24: 15 mL via ORAL
  Filled 2024-04-24: qty 30

## 2024-04-24 MED ORDER — VITAMIN B-12 1000 MCG PO TABS: 1000.0000 ug | ORAL_TABLET | Freq: Every day | ORAL | Status: DC

## 2024-04-24 NOTE — Plan of Care (Signed)

## 2024-04-24 NOTE — Progress Notes (Signed)
 [facility] Progress Note  Patient: Kathy Sharp    FMW:[id number]  DOA: 04/23/2024     Date of Service: the patient was seen and examined on 04/24/2024  Chief Complaint  Patient presents with   Abdominal Pain   Nausea   Emesis   Brief hospital course: Kathy Sharp is a 48 y.o. female with medical history significant of menorrhagia, presented with multiple complaints including generalized weakness, right flank pain, fever/chills, nausea/vomiting and dysuria.   Symptoms started 3 days ago, patient started to have burning sensations and urinary frequency, initially she thought that was related to  constipation, but soon she started to develop episode chills and fever, checked temperature Tmax was 100.3.  Since yesterday she also started develop nauseous and vomiting.  She reported heavy menstrual period and irregular, with large amount blood clots passing during menstrual period.  Does not have a OB/GYN doctor due to insurance issue.  ED Course: Temperature 102.7 tachycardia blood pressure 112/64 O2 saturation 100% on room air.  UA showed 3+ WBC, 1+ RBC.  CT abdomen pelvis showed large uterine fibroid otherwise no acute findings.   Patient was given Levaquin and Flagyl  in the ED.   Assessment and Plan:  # UTI, UA positive LE moderate, nitrate negative, bacteria rare, WBC >50 Acute pyelonephritis questionable, no significant findings on CT scan but presented with right flank pain. -Continue ceftriaxone Blood culture NGTD - Urine culture pending   # Symptomatic decompensated chronic iron deficiency anemia Menorrhagia Uterine fibroid -s/p  PRBC x 2 units on 10/23 - s/p IV Venofer 400 mg x 1 dose on 10/24 Follow TOC to get appointment for PCP and GYN as an outpatient  10/24 Hb 7.9 Repeat CBC 4 PM  # Vitamin B12 deficiency: B12 level 164, goal >400. started vitamin B12 1000 mcg IM injection daily during hospital stay, followed by oral supplement.  Follow-up PCP to repeat vitamin  B12 level after 3 to 6 months.  # Vitamin D insufficiency: started vitamin D 50,000 units p.o. weekly, follow with PCP to repeat vitamin D level after 3 to 6 months.    # Hypokalemia: Potassium repleted Monitor electrolytes daily.   Body mass index is 20.63 kg/m.  Interventions:   Diet: Regular diet DVT Prophylaxis: SCDs  Advance goals of care discussion: Full code  Family Communication: family was present at bedside, at the time of interview.  The pt provided permission to discuss medical plan with the family. Opportunity was given to ask question and all questions were answered satisfactorily.   Disposition:  Pt is from Home, admitted with acute pyelonephritis, still on IV antibiotic, which precludes a safe discharge. Discharge to home, when stable, most likely in 1 to 2 days.  Subjective: No significant events overnight.  Patient still complaining of some abdominal pain and belching, requesting for some medicine for gas.  Denied any chest pain or palpitation, no shortness of breath.  Physical Exam: General: NAD, lying comfortably Appear in no distress, affect appropriate Eyes: PERRLA ENT: Oral Mucosa Clear, moist  Neck: no JVD,  Cardiovascular: S1 and S2 Present, no Murmur,  Respiratory: good respiratory effort, Bilateral Air entry equal and Decreased, no Crackles, no wheezes Abdomen: BS present, Soft and mild central tenderness,  Skin: no rashes Extremities: no Pedal edema, no calf tenderness Neurologic: without any new focal findings Gait not checked due to patient safety concerns  Vitals:   04/23/24 2145 04/23/24 2255 04/24/24 0403 04/24/24 0756  BP:  111/65 103/67 101/67  Pulse:  85  72 68  Resp:  18 17 16   Temp: 98.5 F (36.9 C) 99.1 F (37.3 C) 98.3 F (36.8 C) 97.7 F (36.5 C)  TempSrc: Oral Oral    SpO2:  100% 99% 99%  Weight:      Height:        Intake/Output Summary (Last 24 hours) at 04/24/2024 1529 Last data filed at 04/24/2024 1053 Gross per  24 hour  Intake 2695.34 ml  Output --  Net 2695.34 ml   Filed Weights   04/23/24 1229  Weight: 56.2 kg    Data Reviewed: I have personally reviewed and interpreted daily labs, tele strips, imagings as discussed above. I reviewed all nursing notes, pharmacy notes, vitals, pertinent old records I have discussed plan of care as described above with RN and patient/family.  CBC: Recent Labs  Lab 04/23/24 1233 04/23/24 1426 04/24/24 0025  WBC 7.4 8.3  --   NEUTROABS 6.1 6.4  --   HGB 5.6* 5.4* 7.9*  HCT 21.6* 21.0* 27.0*  MCV 66.5* 66.0*  --   PLT 568* 563*  --    Basic Metabolic Panel: Recent Labs  Lab 04/23/24 1233 04/23/24 1426 04/24/24 0829  NA 136  --  142  K 3.3*  --  3.7  CL 106  --  109  CO2 20*  --  22  GLUCOSE 98  --  90  BUN 12  --  8  CREATININE 0.84  --  0.90  CALCIUM 8.7*  --  8.7*  MG  --  1.9 1.9  PHOS  --   --  2.9    Studies: CT ABDOMEN PELVIS W CONTRAST Result Date: 04/23/2024 CLINICAL DATA:  Right lower quadrant pain with fever, nausea, vomiting and chills 4 days. EXAM: CT ABDOMEN AND PELVIS WITH CONTRAST TECHNIQUE: Multidetector CT imaging of the abdomen and pelvis was performed using the standard protocol following bolus administration of intravenous contrast. RADIATION DOSE REDUCTION: This exam was performed according to the departmental dose-optimization program which includes automated exposure control, adjustment of the mA and/or kV according to patient size and/or use of iterative reconstruction technique. CONTRAST:  100mL OMNIPAQUE IOHEXOL 300 MG/ML  SOLN COMPARISON:  None Available. FINDINGS: Lower chest: Heart is normal size.  Visualized lung bases are clear. Hepatobiliary: Liver, gallbladder and biliary tree are normal. Pancreas: Normal. Spleen: Normal. Adrenals/Urinary Tract: Adrenal glands are normal. Kidneys are normal size. There is no hydronephrosis or nephrolithiasis. There are a few bilateral subcentimeter renal cortical hypodensities  too small to characterize but likely cysts. No further imaging follow-up is recommended. Ureters and bladder are normal. Stomach/Bowel: Stomach and small bowel are normal. Appendix is normal. Colon is normal. Vascular/Lymphatic: Abdominal aorta is normal in caliber. Remaining vascular structures are unremarkable. No adenopathy. Reproductive: Mildly enlarged uterus containing a heterogeneous enhancing rounded mass over the left anterior aspect of the uterine body measuring approximately 6.4 x 7 cm likely a fibroid, but indeterminate. Evidence of previous bilateral tubal ligation. Right adnexa is unremarkable. There is a 4 cm predominately simple appearing left ovarian cyst. Other: Minimal free fluid in the cul-de-sac. Musculoskeletal: IMPRESSION: 1. No acute findings in the abdomen/pelvis. Normal appendix. 2. 4 cm predominately simple appearing left ovarian cyst. Minimal free fluid in the cul-de-sac. No follow-up imaging recommended. 3. Mildly enlarged uterus containing a heterogeneous enhancing rounded mass over the left anterior aspect of the uterine body measuring 6.4 x 7 cm likely a fibroid, but indeterminate. Recommend pelvic ultrasound on an elective basis for further characterization.  4. Few bilateral subcentimeter renal cortical hypodensities too small to characterize, but likely cysts. Electronically Signed   By: Toribio Agreste M.D.   On: 04/23/2024 15:51    Scheduled Meds:  alum & mag hydroxide-simeth  15 mL Oral Once   ascorbic acid  500 mg Oral Daily   cyanocobalamin  1,000 mcg Intramuscular Q1200   Followed by   NOREEN ON 05/01/2024] vitamin B-12  1,000 mcg Oral Daily   iron polysaccharides  150 mg Oral Daily   pantoprazole  40 mg Oral Daily   Vitamin D (Ergocalciferol)  50,000 Units Oral Q7 days   Continuous Infusions:  cefTRIAXone (ROCEPHIN)  IV 2 g (04/24/24 1445)   PRN Meds: acetaminophen  **OR** acetaminophen , alum & mag hydroxide-simeth, bisacodyl, HYDROcodone -acetaminophen ,  ondansetron  **OR** ondansetron  (ZOFRAN ) IV, senna-docusate  Time spent: 55 minutes  Author: ELVAN SOR. MD [facility] 04/24/2024 3:29 PM  To reach On-call, see care teams to locate the attending and reach out to them via [email or web address]. If 7PM-7AM, please contact night-coverage If you still have difficulty reaching the attending provider, please page the [facility] (Director on Call) for [facility] on amion for assistance.

## 2024-04-24 NOTE — Discharge Instructions (Signed)
 You are encouraged to call [facility] and arrange an application appointment to be screened for service to get set up with a physician for primary care  You may print the application and take with you to the appointment. At this web address [email or web address] Please Call [facility] for appointment  [phone]  [address] [place], [place] [address]  Office Hours Monday: Closed Tuesday: 9:00am - 4:00pm Wednesday: 9:00am - 4:00pm Thursday: 9:00am - 8:00pm Friday: Closed  Endocrinology 2nd Thursday of the Month: 5:00pm - 8:00pm  Rheumatology/Orthopedic Clinic By appointment only.  Contact for availability.     Services Not Covered Obstetrics Gastrointestinal/Liver Disease   Some PCP options in [place] area- not a comprehensive list  [facility]- [phone] [facility]- [phone] [facility]- [phone] [facility]- [phone] [facility]- [phone] [facility]- [phone]  or [facility] Physician Referral Line [phone]

## 2024-04-24 NOTE — TOC CM/SW Note (Signed)
 Transition of Care [facility]) - Inpatient Brief Assessment   Patient Details  Name: Kathy Sharp MRN: [id number] Date of Birth: 05-09-1976  Transition of Care [facility]) CM/SW Contact:    Daved JONETTA Hamilton, RN Phone Number: 04/24/2024, 10:36 AM   Clinical Narrative:   Transition of Care (TOC) Screening Note   Patient Details  Name: Kathy Sharp Date of Birth: 02/09/1976   Transition of Care [facility]) CM/SW Contact:    Daved JONETTA Hamilton, RN Phone Number: 04/24/2024, 10:36 AM    Transition of Care Department [facility]) has reviewed patient and no TOC needs have been identified at this time.  If new patient transition needs arise, please place a TOC consult.  PCP and [facility] resources added to AVS    Transition of Care Asessment: Insurance and Status: Selfpay Patient has primary care physician: No (PCP list and [facility] resources added to AVS)   Prior level of function:: independent Prior/Current Home Services: No current home services Social Drivers of Health Review: SDOH reviewed no interventions necessary Readmission risk has been reviewed: Yes Transition of care needs: no transition of care needs at this time

## 2024-04-25 ENCOUNTER — Other Ambulatory Visit: Payer: Self-pay

## 2024-04-25 LAB — CBC
HCT: 31.2 % — ABNORMAL LOW (ref 36.0–46.0)
Hemoglobin: 8.8 g/dL — ABNORMAL LOW (ref 12.0–15.0)
MCH: 20.3 pg — ABNORMAL LOW (ref 26.0–34.0)
MCHC: 28.2 g/dL — ABNORMAL LOW (ref 30.0–36.0)
MCV: 71.9 fL — ABNORMAL LOW (ref 80.0–100.0)
Platelets: 483 K/uL — ABNORMAL HIGH (ref 150–400)
RBC: 4.34 MIL/uL (ref 3.87–5.11)
RDW: 25.9 % — ABNORMAL HIGH (ref 11.5–15.5)
WBC: 4.8 K/uL (ref 4.0–10.5)
nRBC: 0.6 % — ABNORMAL HIGH (ref 0.0–0.2)

## 2024-04-25 LAB — URINE CULTURE: Culture: >=100000 — AB

## 2024-04-25 LAB — BASIC METABOLIC PANEL WITH GFR
Anion gap: 14 (ref 5–15)
BUN: 8 mg/dL (ref 6–20)
CO2: 22 mmol/L (ref 22–32)
Calcium: 9 mg/dL (ref 8.9–10.3)
Chloride: 107 mmol/L (ref 98–111)
Creatinine, Ser: 0.74 mg/dL (ref 0.44–1.00)
GFR, Estimated: >60 mL/min (ref >60)
Glucose, Bld: 83 mg/dL (ref 70–99)
Potassium: 3.7 mmol/L (ref 3.5–5.1)
Sodium: 143 mmol/L (ref 135–145)

## 2024-04-25 LAB — PHOSPHORUS: Phosphorus: 4.1 mg/dL (ref 2.5–4.6)

## 2024-04-25 LAB — MAGNESIUM: Magnesium: 2.1 mg/dL (ref 1.7–2.4)

## 2024-04-25 MED ORDER — CYANOCOBALAMIN 1000 MCG PO TABS
1000.0000 ug | ORAL_TABLET | Freq: Every day | ORAL | 2 refills | Status: AC
  Filled 2024-04-25: qty 30, 30d supply, fill #0

## 2024-04-25 MED ORDER — ASCORBIC ACID 500 MG PO TABS
500.0000 mg | ORAL_TABLET | Freq: Every day | ORAL | 2 refills | Status: AC
  Filled 2024-04-25: qty 30, 30d supply, fill #0

## 2024-04-25 MED ORDER — POLYSACCHARIDE IRON COMPLEX 150 MG PO CAPS
150.0000 mg | ORAL_CAPSULE | Freq: Every day | ORAL | 2 refills | Status: AC
  Filled 2024-04-25: qty 30, 30d supply, fill #0

## 2024-04-25 MED ORDER — VITAMIN D (ERGOCALCIFEROL) 1.25 MG (50000 UNIT) PO CAPS
50000.0000 [IU] | ORAL_CAPSULE | ORAL | 0 refills | Status: AC
  Filled 2024-04-25: qty 12, 84d supply, fill #0

## 2024-04-25 MED ORDER — CIPROFLOXACIN HCL 500 MG PO TABS
500.0000 mg | ORAL_TABLET | Freq: Two times a day (BID) | ORAL | 0 refills | Status: AC
  Filled 2024-04-25: qty 14, 7d supply, fill #0

## 2024-04-25 MED ORDER — SODIUM CHLORIDE 0.9 % IV SOLN
2.0000 g | INTRAVENOUS | Status: AC
  Administered 2024-04-25: 2 g via INTRAVENOUS
  Filled 2024-04-25: qty 20

## 2024-04-25 NOTE — Discharge Summary (Signed)
 [facility] Discharge Summary   Patient: Kathy Sharp FMW:[id number]  PCP: Patient, No Pcp Per  Date of admission: 04/23/2024   Date of discharge:  04/25/2024     Discharge Diagnoses:  Principal Problem:   Pyelonephritis Active Problems:   Iron deficiency anemia due to chronic blood loss   Admitted From: Home Disposition:  Home   Recommendations for Outpatient Follow-up:  Follow-up with PCP in 1 week Follow-up with GYN in 1 to 2 weeks Follow up LABS/TEST:  Repeat CBC in 1 to 2 weeks Repeat iron profile, B12 level and vitamin D level after 3 to 6 months.   Diet recommendation: Regular diet  Activity: The patient is advised to gradually reintroduce usual activities, as tolerated  Discharge Condition: stable  Code Status: Full code   History of present illness: As per the H and P dictated on admission.  Hospital Course:   Kathy Sharp is a 48 y.o. female with medical history significant of menorrhagia, presented with multiple complaints including generalized weakness, right flank pain, fever/chills, nausea/vomiting and dysuria.   Symptoms started 3 days ago, patient started to have burning sensations and urinary frequency, initially she thought that was related to  constipation, but soon she started to develop episode chills and fever, checked temperature Tmax was 100.3.  Since yesterday she also started develop nauseous and vomiting.  She reported heavy menstrual period and irregular, with large amount blood clots passing during menstrual period.  Does not have a OB/GYN doctor due to insurance issue.   ED Course: Temperature 102.7 tachycardia blood pressure 112/64 O2 saturation 100% on room air.  UA showed 3+ WBC, 1+ RBC.  CT abdomen pelvis showed large uterine fibroid otherwise no acute findings.   Patient was given Levaquin and Flagyl  in the ED.   Assessment and Plan:  # UTI, UA positive LE moderate, nitrate negative, bacteria rare, WBC >50. Acute pyelonephritis  questionable, no significant findings on CT scan but presented with right flank pain.  S/p ceftriaxone. Blood culture NGTD. Urine culture growing E. Coli, pansensitive.  Patient was discharged on ciprofloxacin  500 mg p.o. twice daily for 7 days.  Recommend to follow with PCP in 1 week.   # Symptomatic decompensated chronic iron deficiency anemia Menorrhagia. Uterine fibroid -s/p  PRBC x 2 units on 10/23 - s/p IV Venofer 400 mg x 1 dose on 10/24 TOC was consulted to help patient to get appointment for PCP and GYN as an outpatient  10/25 Hb 8.8, stable.  No active bleeding at this time.  Patient was recommended to follow-up with PCP and repeat CBC in 1 to 2 weeks.   # Vitamin B12 deficiency: B12 level 164, goal >400. started vitamin B12 1000 mcg IM injection daily during hospital stay, followed by oral supplement.  Follow-up PCP to repeat vitamin B12 level after 3 to 6 months.   # Vitamin D insufficiency: Vitamin D level 26, started vitamin D 50,000 units p.o. weekly, follow with PCP to repeat vitamin D level after 3 to 6 months.   # Hypokalemia: Potassium repleted, and resolved   Body mass index is 20.63 kg/m.  Nutrition Interventions:   Patient was ambulatory without any assistance. On the day of the discharge the patient's vitals were stable, and no other acute medical condition were reported by patient. the patient was felt safe to be discharge at Home.  Consultants: None Procedures: None  Discharge Exam: General: Appear in no distress, Oral Mucosa Clear, moist. Cardiovascular: S1 and S2 Present, no Murmur,  Respiratory: normal respiratory effort, Bilateral Air entry present and no Crackles, no wheezes Abdomen: Bowel Sound present, Soft and no tenderness. Extremities: no Pedal edema, no calf tenderness Neurology: alert and oriented to time, place, and person affect appropriate.  Filed Weights   04/23/24 1229  Weight: 56.2 kg   Vitals:   04/25/24 0404 04/25/24 0748  BP:  113/71 112/63  Pulse: 62 60  Resp: 17 18  Temp: 97.8 F (36.6 C) 97.9 F (36.6 C)  SpO2: 100% 100%    DISCHARGE MEDICATION: Allergies as of 04/25/2024       Reactions   Penicillins Anaphylaxis, Hives, Other (See Comments)   Pt has taken cephalosporins without any problem.    Latex Other (See Comments)   Pt states she had Irritation in vagina from gloves after exam.        Medication List     TAKE these medications    ascorbic acid 500 MG tablet Commonly known as: VITAMIN C Take 1 tablet (500 mg total) by mouth daily. Start taking on: April 26, 2024   ciprofloxacin  500 MG tablet Commonly known as: Cipro  Take 1 tablet (500 mg total) by mouth 2 (two) times daily for 7 days.   cyanocobalamin 1000 MCG tablet Take 1 tablet (1,000 mcg total) by mouth daily. Start taking on: May 01, 2024   iron polysaccharides 150 MG capsule Commonly known as: NIFEREX Take 1 capsule (150 mg total) by mouth daily. Start taking on: April 26, 2024   Levonorgestrel -Ethinyl Estradiol 0.15-0.03 &0.01 MG tablet Commonly known as: AMETHIA Take 1 tablet by mouth at bedtime.   Vitamin D (Ergocalciferol) 1.25 MG (50000 UNIT) Caps capsule Commonly known as: DRISDOL Take 1 capsule (50,000 Units total) by mouth every 7 (seven) days. Start taking on: May 01, 2024       Allergies  Allergen Reactions   Penicillins Anaphylaxis, Hives and Other (See Comments)    Pt has taken cephalosporins without any problem.    Latex Other (See Comments)    Pt states she had Irritation in vagina from gloves after exam.   Discharge Instructions     Call MD for:  difficulty breathing, headache or visual disturbances   Complete by: As directed    Call MD for:  extreme fatigue   Complete by: As directed    Call MD for:  persistant dizziness or light-headedness   Complete by: As directed    Call MD for:  persistant nausea and vomiting   Complete by: As directed    Call MD for:  severe  uncontrolled pain   Complete by: As directed    Call MD for:  temperature >100.4   Complete by: As directed    Diet general   Complete by: As directed    Discharge instructions   Complete by: As directed    Follow-up with PCP in 1 week Follow-up with GYN in 1 to 2 weeks Repeat CBC in 1 to 2 weeks Repeat iron profile, B12 level and vitamin D level after 3 to 6 months.   Increase activity slowly   Complete by: As directed        The results of significant diagnostics from this hospitalization (including imaging, microbiology, ancillary and laboratory) are listed below for reference.    Significant Diagnostic Studies: CT ABDOMEN PELVIS W CONTRAST Result Date: 04/23/2024 CLINICAL DATA:  Right lower quadrant pain with fever, nausea, vomiting and chills 4 days. EXAM: CT ABDOMEN AND PELVIS WITH CONTRAST TECHNIQUE: Multidetector CT imaging of  the abdomen and pelvis was performed using the standard protocol following bolus administration of intravenous contrast. RADIATION DOSE REDUCTION: This exam was performed according to the departmental dose-optimization program which includes automated exposure control, adjustment of the mA and/or kV according to patient size and/or use of iterative reconstruction technique. CONTRAST:  100mL OMNIPAQUE IOHEXOL 300 MG/ML  SOLN COMPARISON:  None Available. FINDINGS: Lower chest: Heart is normal size.  Visualized lung bases are clear. Hepatobiliary: Liver, gallbladder and biliary tree are normal. Pancreas: Normal. Spleen: Normal. Adrenals/Urinary Tract: Adrenal glands are normal. Kidneys are normal size. There is no hydronephrosis or nephrolithiasis. There are a few bilateral subcentimeter renal cortical hypodensities too small to characterize but likely cysts. No further imaging follow-up is recommended. Ureters and bladder are normal. Stomach/Bowel: Stomach and small bowel are normal. Appendix is normal. Colon is normal. Vascular/Lymphatic: Abdominal aorta is normal  in caliber. Remaining vascular structures are unremarkable. No adenopathy. Reproductive: Mildly enlarged uterus containing a heterogeneous enhancing rounded mass over the left anterior aspect of the uterine body measuring approximately 6.4 x 7 cm likely a fibroid, but indeterminate. Evidence of previous bilateral tubal ligation. Right adnexa is unremarkable. There is a 4 cm predominately simple appearing left ovarian cyst. Other: Minimal free fluid in the cul-de-sac. Musculoskeletal: IMPRESSION: 1. No acute findings in the abdomen/pelvis. Normal appendix. 2. 4 cm predominately simple appearing left ovarian cyst. Minimal free fluid in the cul-de-sac. No follow-up imaging recommended. 3. Mildly enlarged uterus containing a heterogeneous enhancing rounded mass over the left anterior aspect of the uterine body measuring 6.4 x 7 cm likely a fibroid, but indeterminate. Recommend pelvic ultrasound on an elective basis for further characterization. 4. Few bilateral subcentimeter renal cortical hypodensities too small to characterize, but likely cysts. Electronically Signed   By: Toribio Agreste M.D.   On: 04/23/2024 15:51   DG Chest Port 1 View Result Date: 04/23/2024 EXAM: 1 VIEW(S) XRAY OF THE CHEST 04/23/2024 02:02:32 PM COMPARISON: 10/10/2011. CLINICAL HISTORY: Questionable sepsis - evaluate for abnormality. Questionable sepsis. FINDINGS: LUNGS AND PLEURA: No focal pulmonary opacity. No pulmonary edema. No pleural effusion. No pneumothorax. HEART AND MEDIASTINUM: No acute abnormality of the cardiac and mediastinal silhouettes. BONES AND SOFT TISSUES: No acute osseous abnormality. IMPRESSION: 1. Normal chest radiograph without acute cardiopulmonary abnormality. Electronically signed by: Waddell Calk MD 04/23/2024 02:35 PM EDT RP Workstation: [id number]    Microbiology: Recent Results (from the past 240 hours)  Blood Culture (routine x 2)     Status: None (Preliminary result)   Collection Time: 04/23/24  2:16 PM    Specimen: BLOOD  Result Value Ref Range Status   Specimen Description BLOOD BLOOD LEFT FOREARM  Final   Special Requests   Final    BOTTLES DRAWN AEROBIC AND ANAEROBIC Blood Culture adequate volume   Culture   Final    NO GROWTH 2 DAYS Performed at [facility], [address] [place], [place] [address]    Report Status PENDING  Incomplete  Resp panel by RT-PCR (RSV, Flu A&B, Covid) Anterior Nasal Swab     Status: None   Collection Time: 04/23/24  2:19 PM   Specimen: Anterior Nasal Swab  Result Value Ref Range Status   SARS Coronavirus 2 by RT PCR NEGATIVE NEGATIVE Final    Comment: (NOTE) SARS-CoV-2 target nucleic acids are NOT DETECTED.  The SARS-CoV-2 RNA is generally detectable in upper respiratory specimens during the acute phase of infection. The lowest concentration of SARS-CoV-2 viral copies this assay can detect is  138 copies/mL. A negative result does not preclude SARS-Cov-2 infection and should not be used as the sole basis for treatment or other patient management decisions. A negative result may occur with  improper specimen collection/handling, submission of specimen other than nasopharyngeal swab, presence of viral mutation(s) within the areas targeted by this assay, and inadequate number of viral copies(<138 copies/mL). A negative result must be combined with clinical observations, patient history, and epidemiological information. The expected result is Negative.  Fact Sheet for Patients:  bloggercourse.com  Fact Sheet for Healthcare Providers:  seriousbroker.it  This test is no t yet approved or cleared by the [place]  FDA and  has been authorized for detection and/or diagnosis of SARS-CoV-2 by FDA under an Emergency Use Authorization (EUA). This EUA will remain  in effect (meaning this test can be used) for the duration of the COVID-19 declaration under Section 564(b)(1) of the Act,  21 U.S.C.section 360bbb-3(b)(1), unless the authorization is terminated  or revoked sooner.       Influenza A by PCR NEGATIVE NEGATIVE Final   Influenza B by PCR NEGATIVE NEGATIVE Final    Comment: (NOTE) The Xpert Xpress SARS-CoV-2/FLU/RSV plus assay is intended as an aid in the diagnosis of influenza from Nasopharyngeal swab specimens and should not be used as a sole basis for treatment. Nasal washings and aspirates are unacceptable for Xpert Xpress SARS-CoV-2/FLU/RSV testing.  Fact Sheet for Patients: bloggercourse.com  Fact Sheet for Healthcare Providers: seriousbroker.it  This test is not yet approved or cleared by the [place]  FDA and has been authorized for detection and/or diagnosis of SARS-CoV-2 by FDA under an Emergency Use Authorization (EUA). This EUA will remain in effect (meaning this test can be used) for the duration of the COVID-19 declaration under Section 564(b)(1) of the Act, 21 U.S.C. section 360bbb-3(b)(1), unless the authorization is terminated or revoked.     Resp Syncytial Virus by PCR NEGATIVE NEGATIVE Final    Comment: (NOTE) Fact Sheet for Patients: bloggercourse.com  Fact Sheet for Healthcare Providers: seriousbroker.it  This test is not yet approved or cleared by the [place]  FDA and has been authorized for detection and/or diagnosis of SARS-CoV-2 by FDA under an Emergency Use Authorization (EUA). This EUA will remain in effect (meaning this test can be used) for the duration of the COVID-19 declaration under Section 564(b)(1) of the Act, 21 U.S.C. section 360bbb-3(b)(1), unless the authorization is terminated or revoked.  Performed at [facility], [address] [place], [place] [address]   Blood Culture (routine x 2)     Status: None (Preliminary result)   Collection Time: 04/23/24  2:19 PM   Specimen: BLOOD  Result  Value Ref Range Status   Specimen Description BLOOD BLOOD RIGHT FOREARM  Final   Special Requests   Final    BOTTLES DRAWN AEROBIC AND ANAEROBIC Blood Culture adequate volume   Culture   Final    NO GROWTH 2 DAYS Performed at [facility], [address], [place], [place] [address]    Report Status PENDING  Incomplete  Urine Culture     Status: Abnormal   Collection Time: 04/23/24  2:19 PM   Specimen: Urine, Clean Catch  Result Value Ref Range Status   Specimen Description   Final    URINE, CLEAN CATCH Performed at [facility], [address], [place], [place] [address]    Special Requests   Final    NONE Reflexed from [id number] Performed at [facility], [phone]  [address] [place], [place] [address]    Culture >=100,000 COLONIES/mL ESCHERICHIA COLI (A)  Final   Report Status 04/25/2024 FINAL  Final   Organism ID, Bacteria ESCHERICHIA COLI (A)  Final      Susceptibility   Escherichia coli - MIC*    AMPICILLIN <=2 SENSITIVE Sensitive     CEFAZOLIN  (URINE) Value in next row Sensitive      <=1 SENSITIVEThis is a modified FDA-approved test that has been validated and its performance characteristics determined by the reporting laboratory.  This laboratory is certified under the Clinical Laboratory Improvement Amendments CLIA as qualified to perform high complexity clinical laboratory testing.    CEFEPIME Value in next row Sensitive      <=1 SENSITIVEThis is a modified FDA-approved test that has been validated and its performance characteristics determined by the reporting laboratory.  This laboratory is certified under the Clinical Laboratory Improvement Amendments CLIA as qualified to perform high complexity clinical laboratory testing.    ERTAPENEM Value in next row Sensitive      <=1 SENSITIVEThis is a modified FDA-approved test that has been validated and its performance characteristics determined by the reporting laboratory.  This laboratory is certified under the  Clinical Laboratory Improvement Amendments CLIA as qualified to perform high complexity clinical laboratory testing.    CEFTRIAXONE Value in next row Sensitive      <=1 SENSITIVEThis is a modified FDA-approved test that has been validated and its performance characteristics determined by the reporting laboratory.  This laboratory is certified under the Clinical Laboratory Improvement Amendments CLIA as qualified to perform high complexity clinical laboratory testing.    CIPROFLOXACIN  Value in next row Sensitive      <=1 SENSITIVEThis is a modified FDA-approved test that has been validated and its performance characteristics determined by the reporting laboratory.  This laboratory is certified under the Clinical Laboratory Improvement Amendments CLIA as qualified to perform high complexity clinical laboratory testing.    GENTAMICIN Value in next row Sensitive      <=1 SENSITIVEThis is a modified FDA-approved test that has been validated and its performance characteristics determined by the reporting laboratory.  This laboratory is certified under the Clinical Laboratory Improvement Amendments CLIA as qualified to perform high complexity clinical laboratory testing.    NITROFURANTOIN Value in next row Sensitive      <=1 SENSITIVEThis is a modified FDA-approved test that has been validated and its performance characteristics determined by the reporting laboratory.  This laboratory is certified under the Clinical Laboratory Improvement Amendments CLIA as qualified to perform high complexity clinical laboratory testing.    TRIMETH/SULFA Value in next row Sensitive      <=1 SENSITIVEThis is a modified FDA-approved test that has been validated and its performance characteristics determined by the reporting laboratory.  This laboratory is certified under the Clinical Laboratory Improvement Amendments CLIA as qualified to perform high complexity clinical laboratory testing.    AMPICILLIN/SULBACTAM Value in next  row Sensitive      <=1 SENSITIVEThis is a modified FDA-approved test that has been validated and its performance characteristics determined by the reporting laboratory.  This laboratory is certified under the Clinical Laboratory Improvement Amendments CLIA as qualified to perform high complexity clinical laboratory testing.    PIP/TAZO Value in next row Sensitive      <=4 SENSITIVEThis is a modified FDA-approved test that has been validated and its performance characteristics determined by the reporting laboratory.  This laboratory is certified under the Clinical Laboratory Improvement Amendments CLIA as  qualified to perform high complexity clinical laboratory testing.    MEROPENEM Value in next row Sensitive      <=4 SENSITIVEThis is a modified FDA-approved test that has been validated and its performance characteristics determined by the reporting laboratory.  This laboratory is certified under the Clinical Laboratory Improvement Amendments CLIA as qualified to perform high complexity clinical laboratory testing.    * >=100,000 COLONIES/mL ESCHERICHIA COLI     Labs: CBC: Recent Labs  Lab 04/23/24 1233 04/23/24 1426 04/24/24 0025 04/24/24 1619 04/25/24 0705  WBC 7.4 8.3  --  8.3 4.8  NEUTROABS 6.1 6.4  --   --   --   HGB 5.6* 5.4* 7.9* 8.7* 8.8*  HCT 21.6* 21.0* 27.0* 29.4* 31.2*  MCV 66.5* 66.0*  --  70.8* 71.9*  PLT 568* 563*  --  529* 483*   Basic Metabolic Panel: Recent Labs  Lab 04/23/24 1233 04/23/24 1426 04/24/24 0829 04/25/24 0705  NA 136  --  142 143  K 3.3*  --  3.7 3.7  CL 106  --  109 107  CO2 20*  --  22 22  GLUCOSE 98  --  90 83  BUN 12  --  8 8  CREATININE 0.84  --  0.90 0.74  CALCIUM 8.7*  --  8.7* 9.0  MG  --  1.9 1.9 2.1  PHOS  --   --  2.9 4.1   Liver Function Tests: Recent Labs  Lab 04/23/24 1233  AST 22  ALT 15  ALKPHOS 54  BILITOT 0.9  PROT 7.7  ALBUMIN 3.7   No results for input(s): LIPASE, AMYLASE in the last 168 hours. No results  for input(s): AMMONIA in the last 168 hours. Cardiac Enzymes: No results for input(s): CKTOTAL, CKMB, CKMBINDEX, TROPONINI in the last 168 hours. BNP (last 3 results) No results for input(s): BNP in the last 8760 hours. CBG: No results for input(s): GLUCAP in the last 168 hours.  Time spent: 35 minutes  Signed:  Elvan Sor  [facility] 04/25/2024 11:37 AM

## 2024-04-25 NOTE — Plan of Care (Signed)
  Problem: Clinical Measurements: Goal: Ability to maintain clinical measurements within normal limits will improve Outcome: Progressing   Problem: Clinical Measurements: Goal: Respiratory complications will improve Outcome: Progressing   Problem: Clinical Measurements: Goal: Cardiovascular complication will be avoided Outcome: Progressing   Problem: Activity: Goal: Risk for activity intolerance will decrease Outcome: Progressing   Problem: Elimination: Goal: Will not experience complications related to urinary retention Outcome: Progressing   Problem: Pain Managment: Goal: General experience of comfort will improve and/or be controlled Outcome: Progressing   Problem: Safety: Goal: Ability to remain free from injury will improve Outcome: Progressing

## 2024-04-28 LAB — CULTURE, BLOOD (ROUTINE X 2)
Culture: NO GROWTH
Culture: NO GROWTH
Special Requests: ADEQUATE
Special Requests: ADEQUATE
